# Patient Record
Sex: Female | Born: 1975 | Race: White | Hispanic: No | Marital: Married | Smoking: Never smoker
Health system: Southern US, Community
[De-identification: ages and names within clinical notes are randomized; demographics above are authoritative.]

## PROBLEM LIST (undated history)

## (undated) DIAGNOSIS — J45909 Unspecified asthma, uncomplicated: Secondary | ICD-10-CM

## (undated) DIAGNOSIS — H40033 Anatomical narrow angle, bilateral: Secondary | ICD-10-CM

## (undated) HISTORY — DX: Unspecified asthma, uncomplicated: J45.909

## (undated) HISTORY — DX: Anatomical narrow angle, bilateral: H40.033

---
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
## 2010-03-08 HISTORY — PX: BREAST SURGERY: SHX581

## 2013-03-08 NOTE — L&D Delivery Note (Signed)
Delivery Note At 5:57 AM a viable female was delivered via Vaginal, Spontaneous Delivery (Presentation: ROA).  APGAR: 8, 9 .   Placenta status: Intact, Spontaneous via [name].  Cord: 3 vessels with the following complications: None. Mild uterine atony--blood clots removed from the LUS.  Adequate response to fundal massage.   Anesthesia: Epidural Local  Episiotomy: None Lacerations: 1st degree Suture Repair: 3.0 vicryl rapide Est. Blood Loss (mL):  300 ml  Mom to postpartum.  Baby to Couplet care / Skin to Skin.  [name],Raven Shawhan A 01/13/2014, 6:27 AM

## 2013-06-27 LAB — OB RESULTS CONSOLE HIV ANTIBODY (ROUTINE TESTING): HIV: NONREACTIVE

## 2013-06-27 LAB — OB RESULTS CONSOLE ANTIBODY SCREEN: Antibody Screen: NEGATIVE

## 2013-06-27 LAB — OB RESULTS CONSOLE RPR: RPR: NONREACTIVE

## 2013-06-27 LAB — OB RESULTS CONSOLE HEPATITIS B SURFACE ANTIGEN: Hepatitis B Surface Ag: NEGATIVE

## 2013-06-27 LAB — OB RESULTS CONSOLE ABO/RH: RH TYPE: POSITIVE

## 2013-06-27 LAB — OB RESULTS CONSOLE HGB/HCT, BLOOD
HCT: 35 %
HEMOGLOBIN: 11.9 g/dL

## 2013-06-27 LAB — OB RESULTS CONSOLE RUBELLA ANTIBODY, IGM: RUBELLA: IMMUNE

## 2013-06-27 LAB — OB RESULTS CONSOLE PLATELET COUNT: PLATELETS: 216 10*3/uL

## 2013-10-10 ENCOUNTER — Ambulatory Visit (INDEPENDENT_AMBULATORY_CARE_PROVIDER_SITE_OTHER): Payer: BC Managed Care – PPO | Admitting: Obstetrics & Gynecology

## 2013-10-10 ENCOUNTER — Encounter: Payer: Self-pay | Admitting: Obstetrics & Gynecology

## 2013-10-10 VITALS — BP 101/61 | HR 68 | Temp 97.2°F | Ht 65.0 in | Wt 139.0 lb

## 2013-10-10 DIAGNOSIS — O09522 Supervision of elderly multigravida, second trimester: Secondary | ICD-10-CM

## 2013-10-10 DIAGNOSIS — Z3482 Encounter for supervision of other normal pregnancy, second trimester: Secondary | ICD-10-CM

## 2013-10-10 DIAGNOSIS — Z3480 Encounter for supervision of other normal pregnancy, unspecified trimester: Secondary | ICD-10-CM

## 2013-10-10 DIAGNOSIS — O09529 Supervision of elderly multigravida, unspecified trimester: Secondary | ICD-10-CM

## 2013-10-10 LAB — CBC
HCT: 32.1 % — ABNORMAL LOW (ref 36.0–46.0)
Hemoglobin: 10.6 g/dL — ABNORMAL LOW (ref 12.0–15.0)
MCH: 28 pg (ref 26.0–34.0)
MCHC: 33 g/dL (ref 30.0–36.0)
MCV: 84.9 fL (ref 78.0–100.0)
PLATELETS: 191 10*3/uL (ref 150–400)
RBC: 3.78 MIL/uL — ABNORMAL LOW (ref 3.87–5.11)
RDW: 14.4 % (ref 11.5–15.5)
WBC: 8.1 10*3/uL (ref 4.0–10.5)

## 2013-10-10 NOTE — Patient Instructions (Signed)
Tdap Vaccine (Tetanus, Diphtheria, Pertussis): What You Need to Know 1. Why get vaccinated? Tetanus, diphtheria and pertussis can be very serious diseases, even for adolescents and adults. Tdap vaccine can protect us from these diseases. TETANUS (Lockjaw) causes painful muscle tightening and stiffness, usually all over the body.  It can lead to tightening of muscles in the head and neck so you can't open your mouth, swallow, or sometimes even breathe. Tetanus kills about 1 out of 5 people who are infected. DIPHTHERIA can cause a thick coating to form in the back of the throat.  It can lead to breathing problems, paralysis, heart failure, and death. PERTUSSIS (Whooping Cough) causes severe coughing spells, which can cause difficulty breathing, vomiting and disturbed sleep.  It can also lead to weight loss, incontinence, and rib fractures. Up to 2 in 100 adolescents and 5 in 100 adults with pertussis are hospitalized or have complications, which could include pneumonia or death. These diseases are caused by bacteria. Diphtheria and pertussis are spread from person to person through coughing or sneezing. Tetanus enters the body through cuts, scratches, or wounds. Before vaccines, the [place] saw as many as 200,000 cases a year of diphtheria and pertussis, and hundreds of cases of tetanus. Since vaccination began, tetanus and diphtheria have dropped by about 99% and pertussis by about 80%. 2. Tdap vaccine Tdap vaccine can protect adolescents and adults from tetanus, diphtheria, and pertussis. One dose of Tdap is routinely given at age 11 or 12. People who did not get Tdap at that age should get it as soon as possible. Tdap is especially important for health care professionals and anyone having close contact with a baby younger than 12 months. Pregnant women should get a dose of Tdap during every pregnancy, to protect the newborn from pertussis. Infants are most at risk for severe, life-threatening  complications from pertussis. A similar vaccine, called Td, protects from tetanus and diphtheria, but not pertussis. A Td booster should be given every 10 years. Tdap may be given as one of these boosters if you have not already gotten a dose. Tdap may also be given after a severe cut or burn to prevent tetanus infection. Your doctor can give you more information. Tdap may safely be given at the same time as other vaccines. 3. Some people should not get this vaccine  If you ever had a life-threatening allergic reaction after a dose of any tetanus, diphtheria, or pertussis containing vaccine, OR if you have a severe allergy to any part of this vaccine, you should not get Tdap. Tell your doctor if you have any severe allergies.  If you had a coma, or long or multiple seizures within 7 days after a childhood dose of DTP or DTaP, you should not get Tdap, unless a cause other than the vaccine was found. You can still get Td.  Talk to your doctor if you:  have epilepsy or another nervous system problem,  had severe pain or swelling after any vaccine containing diphtheria, tetanus or pertussis,  ever had Guillain-Barr Syndrome (GBS),  aren't feeling well on the day the shot is scheduled. 4. Risks of a vaccine reaction With any medicine, including vaccines, there is a chance of side effects. These are usually mild and go away on their own, but serious reactions are also possible. Brief fainting spells can follow a vaccination, leading to injuries from falling. Sitting or lying down for about 15 minutes can help prevent these. Tell your doctor if you feel   dizzy or light-headed, or have vision changes or ringing in the ears. Mild problems following Tdap (Did not interfere with activities)  Pain where the shot was given (about 3 in 4 adolescents or 2 in 3 adults)  Redness or swelling where the shot was given (about 1 person in 5)  Mild fever of at least 100.78F (up to about 1 in 25 adolescents or  1 in 100 adults)  Headache (about 3 or 4 people in 10)  Tiredness (about 1 person in 3 or 4)  Nausea, vomiting, diarrhea, stomach ache (up to 1 in 4 adolescents or 1 in 10 adults)  Chills, body aches, sore joints, rash, swollen glands (uncommon) Moderate problems following Tdap (Interfered with activities, but did not require medical attention)  Pain where the shot was given (about 1 in 5 adolescents or 1 in 100 adults)  Redness or swelling where the shot was given (up to about 1 in 16 adolescents or 1 in 25 adults)  Fever over 102F (about 1 in 100 adolescents or 1 in 250 adults)  Headache (about 3 in 20 adolescents or 1 in 10 adults)  Nausea, vomiting, diarrhea, stomach ache (up to 1 or 3 people in 100)  Swelling of the entire arm where the shot was given (up to about 3 in 100). Severe problems following Tdap (Unable to perform usual activities; required medical attention)  Swelling, severe pain, bleeding and redness in the arm where the shot was given (rare). A severe allergic reaction could occur after any vaccine (estimated less than 1 in a million doses). 5. What if there is a serious reaction? What should I look for?  Look for anything that concerns you, such as signs of a severe allergic reaction, very high fever, or behavior changes. Signs of a severe allergic reaction can include hives, swelling of the face and throat, difficulty breathing, a fast heartbeat, dizziness, and weakness. These would start a few minutes to a few hours after the vaccination. What should I do?  If you think it is a severe allergic reaction or other emergency that can't wait, call 9-1-1 or get the person to the nearest hospital. Otherwise, call your doctor.  Afterward, the reaction should be reported to the "Vaccine Adverse Event Reporting System" (VAERS). Your doctor might file this report, or you can do it yourself through the VAERS web site at www.vaers.LAgents.no, or by calling  1-760-415-3001. VAERS is only for reporting reactions. They do not give medical advice.  6. The National Vaccine Injury Compensation Program The [employer] Vaccine Injury Compensation Program (VICP) is a federal program that was created to compensate people who may have been injured by certain vaccines. Persons who believe they may have been injured by a vaccine can learn about the program and about filing a claim by calling 1-(405)444-7602 or visiting the VICP website at SpiritualWord.at. 7. How can I learn more?  Ask your doctor.  Call your local or state health department.  Contact the Centers for Disease Control and Prevention (CDC):  Call 224-453-7114 or visit CDC's website at PicCapture.uy. CDC Tdap Vaccine VIS (07/15/11) Document Released: 08/24/2011 Document Revised: 07/09/2013 Document Reviewed: 06/06/2013 ExitCare Patient Information 2015 [place], [place]. This information is not intended to replace advice given to you by your health care provider. Make sure you discuss any questions you have with your health care provider. Levonorgestrel intrauterine device (IUD) What is this medicine? LEVONORGESTREL IUD (LEE voe nor jes trel) is a contraceptive (birth control) device. The device is placed  inside the uterus by a healthcare professional. It is used to prevent pregnancy and can also be used to treat heavy bleeding that occurs during your period. Depending on the device, it can be used for 3 to 5 years. This medicine may be used for other purposes; ask your health care provider or pharmacist if you have questions. COMMON BRAND NAME(S): Elveria RoyalsLILETTA, Mirena, Skyla What should I tell my health care provider before I take this medicine? They need to know if you have any of these conditions: -abnormal Pap smear -cancer of the breast, uterus, or cervix -diabetes -endometritis -genital or pelvic infection now or in the past -have more than one sexual partner or your  partner has more than one partner -heart disease -history of an ectopic or tubal pregnancy -immune system problems -IUD in place -liver disease or tumor -problems with blood clots or take blood-thinners -use intravenous drugs -uterus of unusual shape -vaginal bleeding that has not been explained -an unusual or allergic reaction to levonorgestrel, other hormones, silicone, or polyethylene, medicines, foods, dyes, or preservatives -pregnant or trying to get pregnant -breast-feeding How should I use this medicine? This device is placed inside the uterus by a health care professional. Talk to your pediatrician regarding the use of this medicine in children. Special care may be needed. Overdosage: If you think you have taken too much of this medicine contact a poison control center or emergency room at once. NOTE: This medicine is only for you. Do not share this medicine with others. What if I miss a dose? This does not apply. What may interact with this medicine? Do not take this medicine with any of the following medications: -amprenavir -bosentan -fosamprenavir This medicine may also interact with the following medications: -aprepitant -barbiturate medicines for inducing sleep or treating seizures -bexarotene -griseofulvin -medicines to treat seizures like carbamazepine, ethotoin, felbamate, oxcarbazepine, phenytoin, topiramate -modafinil -pioglitazone -rifabutin -rifampin -rifapentine -some medicines to treat HIV infection like atazanavir, indinavir, lopinavir, nelfinavir, tipranavir, ritonavir -St. John's wort -warfarin This list may not describe all possible interactions. Give your health care provider a list of all the medicines, herbs, non-prescription drugs, or dietary supplements you use. Also tell them if you smoke, drink alcohol, or use illegal drugs. Some items may interact with your medicine. What should I watch for while using this medicine? Visit your doctor or  health care professional for regular check ups. See your doctor if you or your partner has sexual contact with others, becomes HIV positive, or gets a sexual transmitted disease. This product does not protect you against HIV infection (AIDS) or other sexually transmitted diseases. You can check the placement of the IUD yourself by reaching up to the top of your vagina with clean fingers to feel the threads. Do not pull on the threads. It is a good habit to check placement after each menstrual period. Call your doctor right away if you feel more of the IUD than just the threads or if you cannot feel the threads at all. The IUD may come out by itself. You may become pregnant if the device comes out. If you notice that the IUD has come out use a backup birth control method like condoms and call your health care provider. Using tampons will not change the position of the IUD and are okay to use during your period. What side effects may I notice from receiving this medicine? Side effects that you should report to your doctor or health care professional as soon as possible: -allergic  reactions like skin rash, itching or hives, swelling of the face, lips, or tongue -fever, flu-like symptoms -genital sores -high blood pressure -no menstrual period for 6 weeks during use -pain, swelling, warmth in the leg -pelvic pain or tenderness -severe or sudden headache -signs of pregnancy -stomach cramping -sudden shortness of breath -trouble with balance, talking, or walking -unusual vaginal bleeding, discharge -yellowing of the eyes or skin Side effects that usually do not require medical attention (report to your doctor or health care professional if they continue or are bothersome): -acne -breast pain -change in sex drive or performance -changes in weight -cramping, dizziness, or faintness while the device is being inserted -headache -irregular menstrual bleeding within first 3 to 6 months of  use -nausea This list may not describe all possible side effects. Call your doctor for medical advice about side effects. You may report side effects to FDA at 1-800-FDA-1088. Where should I keep my medicine? This does not apply. NOTE: This sheet is a summary. It may not cover all possible information. If you have questions about this medicine, talk to your doctor, pharmacist, or health care provider.  2015, [employer]. (2011-03-25 13:54:04) Second Trimester of Pregnancy The second trimester is from week 13 through week 28, months 4 through 6. The second trimester is often a time when you feel your best. Your body has also adjusted to being pregnant, and you begin to feel better physically. Usually, morning sickness has lessened or quit completely, you may have more energy, and you may have an increase in appetite. The second trimester is also a time when the fetus is growing rapidly. At the end of the sixth month, the fetus is about 9 inches long and weighs about 1 pounds. You will likely begin to feel the baby move (quickening) between 18 and 20 weeks of the pregnancy. BODY CHANGES Your body goes through many changes during pregnancy. The changes vary from woman to woman.   Your weight will continue to increase. You will notice your lower abdomen bulging out.  You may begin to get stretch marks on your hips, abdomen, and breasts.  You may develop headaches that can be relieved by medicines approved by your health care provider.  You may urinate more often because the fetus is pressing on your bladder.  You may develop or continue to have heartburn as a result of your pregnancy.  You may develop constipation because certain hormones are causing the muscles that push waste through your intestines to slow down.  You may develop hemorrhoids or swollen, bulging veins (varicose veins).  You may have back pain because of the weight gain and pregnancy hormones relaxing your joints  between the bones in your pelvis and as a result of a shift in weight and the muscles that support your balance.  Your breasts will continue to grow and be tender.  Your gums may bleed and may be sensitive to brushing and flossing.  Dark spots or blotches (chloasma, mask of pregnancy) may develop on your face. This will likely fade after the baby is born.  A dark line from your belly button to the pubic area (linea nigra) may appear. This will likely fade after the baby is born.  You may have changes in your hair. These can include thickening of your hair, rapid growth, and changes in texture. Some women also have hair loss during or after pregnancy, or hair that feels dry or thin. Your hair will most likely return to normal after your  baby is born. WHAT TO EXPECT AT YOUR PRENATAL VISITS During a routine prenatal visit:  You will be weighed to make sure you and the fetus are growing normally.  Your blood pressure will be taken.  Your abdomen will be measured to track your baby's growth.  The fetal heartbeat will be listened to.  Any test results from the previous visit will be discussed. Your health care provider may ask you:  How you are feeling.  If you are feeling the baby move.  If you have had any abnormal symptoms, such as leaking fluid, bleeding, severe headaches, or abdominal cramping.  If you have any questions. Other tests that may be performed during your second trimester include:  Blood tests that check for:  Low iron levels (anemia).  Gestational diabetes (between 24 and 28 weeks).  Rh antibodies.  Urine tests to check for infections, diabetes, or protein in the urine.  An ultrasound to confirm the proper growth and development of the baby.  An amniocentesis to check for possible genetic problems.  Fetal screens for spina bifida and Down syndrome. HOME CARE INSTRUCTIONS   Avoid all smoking, herbs, alcohol, and unprescribed drugs. These chemicals affect  the formation and growth of the baby.  Follow your health care provider's instructions regarding medicine use. There are medicines that are either safe or unsafe to take during pregnancy.  Exercise only as directed by your health care provider. Experiencing uterine cramps is a good sign to stop exercising.  Continue to eat regular, healthy meals.  Wear a good support bra for breast tenderness.  Do not use hot tubs, steam rooms, or saunas.  Wear your seat belt at all times when driving.  Avoid raw meat, uncooked cheese, cat litter boxes, and soil used by cats. These carry germs that can cause birth defects in the baby.  Take your prenatal vitamins.  Try taking a stool softener (if your health care provider approves) if you develop constipation. Eat more high-fiber foods, such as fresh vegetables or fruit and whole grains. Drink plenty of fluids to keep your urine clear or pale yellow.  Take warm sitz baths to soothe any pain or discomfort caused by hemorrhoids. Use hemorrhoid cream if your health care provider approves.  If you develop varicose veins, wear support hose. Elevate your feet for 15 minutes, 3-4 times a day. Limit salt in your diet.  Avoid heavy lifting, wear low heel shoes, and practice good posture.  Rest with your legs elevated if you have leg cramps or low back pain.  Visit your dentist if you have not gone yet during your pregnancy. Use a soft toothbrush to brush your teeth and be gentle when you floss.  A sexual relationship may be continued unless your health care provider directs you otherwise.  Continue to go to all your prenatal visits as directed by your health care provider. SEEK MEDICAL CARE IF:   You have dizziness.  You have mild pelvic cramps, pelvic pressure, or nagging pain in the abdominal area.  You have persistent nausea, vomiting, or diarrhea.  You have a bad smelling vaginal discharge.  You have pain with urination. SEEK IMMEDIATE MEDICAL  CARE IF:   You have a fever.  You are leaking fluid from your vagina.  You have spotting or bleeding from your vagina.  You have severe abdominal cramping or pain.  You have rapid weight gain or loss.  You have shortness of breath with chest pain.  You notice sudden or extreme  swelling of your face, hands, ankles, feet, or legs.  You have not felt your baby move in over an hour.  You have severe headaches that do not go away with medicine.  You have vision changes. Document Released: 02/16/2001 Document Revised: 02/27/2013 Document Reviewed: 04/25/2012 [facility] Patient Information 2015 [place], [place]. This information is not intended to replace advice given to you by your health care provider. Make sure you discuss any questions you have with your health care provider.

## 2013-10-10 NOTE — Progress Notes (Signed)
Subjective:    Raven [name] is a 38 y.o. female being seen today for her obstetrical visit. She is at 7624w2d gestation. Patient reports: no complaints . Fetal movement: normal.  Problem List Items Addressed This Visit     Unprioritized   Elderly multigravida in second trimester    Other Visit Diagnoses   Encounter for supervision of other normal pregnancy in second trimester    -  Primary    Relevant Orders       POCT urinalysis dipstick       Glucose Tolerance, 2 Hours w/1 Hour       CBC       HIV antibody       RPR      Patient Active Problem List   Diagnosis Date Noted  . Elderly multigravida in second trimester 10/10/2013   Objective:    BP 101/61  Pulse 68  Temp(Src) 97.2 F (36.2 C)  Ht 5\' 5"  (1.651 m)  Wt 63.05 kg (139 lb)  BMI 23.13 kg/m2 FHT: 160 BPM  Uterine Size: size equals dates     Assessment:    Pregnancy @ 4024w2d    Plan:    Labs, problem list reviewed and updated 2 hr GTT today Follow up in 4 weeks.

## 2013-10-11 LAB — HIV ANTIBODY (ROUTINE TESTING W REFLEX): HIV 1&2 Ab, 4th Generation: NONREACTIVE

## 2013-10-11 LAB — GLUCOSE TOLERANCE, 2 HOURS W/ 1HR
GLUCOSE, FASTING: 62 mg/dL — AB (ref 70–99)
GLUCOSE: 62 mg/dL — AB (ref 70–170)
Glucose, 2 hour: 74 mg/dL (ref 70–139)

## 2013-10-11 LAB — RPR

## 2013-11-07 ENCOUNTER — Encounter: Payer: Self-pay | Admitting: Obstetrics & Gynecology

## 2013-11-07 ENCOUNTER — Ambulatory Visit (INDEPENDENT_AMBULATORY_CARE_PROVIDER_SITE_OTHER): Payer: BC Managed Care – PPO | Admitting: Obstetrics & Gynecology

## 2013-11-07 VITALS — BP 115/66 | HR 74 | Temp 98.6°F | Wt 141.0 lb

## 2013-11-07 DIAGNOSIS — Z23 Encounter for immunization: Secondary | ICD-10-CM

## 2013-11-07 DIAGNOSIS — Z3480 Encounter for supervision of other normal pregnancy, unspecified trimester: Secondary | ICD-10-CM

## 2013-11-07 DIAGNOSIS — Z3483 Encounter for supervision of other normal pregnancy, third trimester: Secondary | ICD-10-CM

## 2013-11-07 LAB — POCT URINALYSIS DIPSTICK
Bilirubin, UA: NEGATIVE
Blood, UA: NEGATIVE
GLUCOSE UA: NEGATIVE
KETONES UA: NEGATIVE
Leukocytes, UA: NEGATIVE
Nitrite, UA: NEGATIVE
PROTEIN UA: NEGATIVE
Spec Grav, UA: 1.02
Urobilinogen, UA: NEGATIVE
pH, UA: 6

## 2013-11-07 MED ORDER — TETANUS-DIPHTH-ACELL PERTUSSIS 5-2.5-18.5 LF-MCG/0.5 IM SUSP
0.5000 mL | Freq: Once | INTRAMUSCULAR | Status: AC
  Administered 2013-11-07: 0.5 mL via INTRAMUSCULAR

## 2013-11-07 NOTE — Addendum Note (Signed)
Addended by: Odessa Fleming on: 11/07/2013 05:18 PM   Modules accepted: Orders

## 2013-11-07 NOTE — Progress Notes (Signed)
Subjective:    Raven [name] is a 38 y.o. female being seen today for her obstetrical visit. She is at [redacted]w[redacted]d gestation. Patient reports no complaints. Fetal movement: normal.  Problem List Items Addressed This Visit   None     Patient Active Problem List   Diagnosis Date Noted  . Elderly multigravida in second trimester 10/10/2013   Objective:    BP 115/66  Pulse 74  Temp(Src) 98.6 F (37 C)  Wt 63.957 kg (141 lb) FHT:  140 BPM  Uterine Size: size equals dates  Presentation: cephalic     Assessment:    Pregnancy @ [redacted]w[redacted]d weeks   Plan:     labs reviewed, problem list updated TDAP offered  Pediatrician: discussed. Infant feeding: plans to breastfeed. Maternity leave: discussed. Orders Placed This Encounter  Procedures  . US OB Follow Up    Standing Status: Future     Number of Occurrences:      Standing Expiration Date: 01/08/2015    Order Specific Question:  Reason for Exam (SYMPTOM  OR DIAGNOSIS REQUIRED)    Answer:  Growth    Order Specific Question:  Preferred imaging location?    Answer:  [facility]   Cigarette smoking: never smoked.  Follow up in 2 Weeks.

## 2013-11-07 NOTE — Patient Instructions (Signed)

## 2013-11-15 ENCOUNTER — Ambulatory Visit (HOSPITAL_COMMUNITY)
Admission: RE | Admit: 2013-11-15 | Discharge: 2013-11-15 | Disposition: A | Discharge status: Home / Self Care | Payer: BC Managed Care – PPO | Source: Ambulatory Visit | Attending: Obstetrics & Gynecology | Admitting: Obstetrics & Gynecology

## 2013-11-15 ENCOUNTER — Encounter (HOSPITAL_COMMUNITY): Payer: Self-pay

## 2013-11-15 VITALS — BP 113/47 | HR 79 | Wt 140.0 lb

## 2013-11-15 DIAGNOSIS — Z3689 Encounter for other specified antenatal screening: Secondary | ICD-10-CM | POA: Insufficient documentation

## 2013-11-15 DIAGNOSIS — Z363 Encounter for antenatal screening for malformations: Secondary | ICD-10-CM | POA: Insufficient documentation

## 2013-11-15 DIAGNOSIS — Z3483 Encounter for supervision of other normal pregnancy, third trimester: Secondary | ICD-10-CM

## 2013-11-15 DIAGNOSIS — Z1389 Encounter for screening for other disorder: Secondary | ICD-10-CM

## 2013-11-15 DIAGNOSIS — O09523 Supervision of elderly multigravida, third trimester: Secondary | ICD-10-CM

## 2013-11-15 DIAGNOSIS — O09529 Supervision of elderly multigravida, unspecified trimester: Secondary | ICD-10-CM | POA: Diagnosis not present

## 2013-11-21 ENCOUNTER — Encounter: Payer: BC Managed Care – PPO | Admitting: Obstetrics & Gynecology

## 2013-12-14 ENCOUNTER — Encounter: Payer: Self-pay | Admitting: Obstetrics & Gynecology

## 2013-12-14 ENCOUNTER — Ambulatory Visit (INDEPENDENT_AMBULATORY_CARE_PROVIDER_SITE_OTHER): Payer: BC Managed Care – PPO | Admitting: Obstetrics & Gynecology

## 2013-12-14 VITALS — BP 115/71 | HR 91 | Temp 97.7°F | Wt 146.0 lb

## 2013-12-14 DIAGNOSIS — O09523 Supervision of elderly multigravida, third trimester: Secondary | ICD-10-CM

## 2013-12-14 DIAGNOSIS — Z23 Encounter for immunization: Secondary | ICD-10-CM

## 2013-12-14 DIAGNOSIS — Z3483 Encounter for supervision of other normal pregnancy, third trimester: Secondary | ICD-10-CM

## 2013-12-14 DIAGNOSIS — O4693 Antepartum hemorrhage, unspecified, third trimester: Secondary | ICD-10-CM

## 2013-12-14 LAB — CBC
HCT: 31.4 % — ABNORMAL LOW (ref 36.0–46.0)
Hemoglobin: 10.6 g/dL — ABNORMAL LOW (ref 12.0–15.0)
MCH: 27.2 pg (ref 26.0–34.0)
MCHC: 33.8 g/dL (ref 30.0–36.0)
MCV: 80.7 fL (ref 78.0–100.0)
Platelets: 192 10*3/uL (ref 150–400)
RBC: 3.89 MIL/uL (ref 3.87–5.11)
RDW: 14.9 % (ref 11.5–15.5)
WBC: 7.3 10*3/uL (ref 4.0–10.5)

## 2013-12-14 LAB — POCT URINALYSIS DIPSTICK
Bilirubin, UA: NEGATIVE
Blood, UA: NEGATIVE
Glucose, UA: NEGATIVE
KETONES UA: NEGATIVE
Nitrite, UA: NEGATIVE
PH UA: 7
Protein, UA: NEGATIVE
Spec Grav, UA: 1.01
Urobilinogen, UA: NEGATIVE

## 2013-12-14 NOTE — Progress Notes (Signed)
Subjective:    Raven [name] is a 38 y.o. female being seen today for her obstetrical visit. She is at 3961w4d gestation. Patient reports no complaints and episode of spotting several days ago--associated cramping.  Denies recent coitus, trauma.  C/O SOB, fatigue.. Fetal movement: normal.  Problem List Items Addressed This Visit   None    Visit Diagnoses   Encounter for supervision of other normal pregnancy in third trimester    -  Primary    Relevant Orders       POCT urinalysis dipstick (Completed)       Strep B DNA probe       CBC    Vaginal bleeding in pregnancy, third trimester        Relevant Orders       US OB Follow Up       US Fetal BPP W/O Non Stress    Need for immunization against influenza        Relevant Orders       Flu Vaccine QUAD 36+ mos IM (Fluarix) (Completed)      Patient Active Problem List   Diagnosis Date Noted  . Elderly multigravida in second trimester 10/10/2013   Objective:    BP 115/71  Pulse 91  Temp(Src) 97.7 F (36.5 C)  Wt 66.225 kg (146 lb) FHT:  140 BPM  Uterine Size: size equals dates  Presentation: cephalic     Assessment:    Pregnancy @ 5761w4d weeks   Plan:     labs reviewed, problem list updated TDAP offered  Pediatrician: discussed. Infant feeding: plans to breastfeed. Maternity leave: discussed. Orders Placed This Encounter  Procedures  . Strep B DNA probe  . US OB Follow Up    Standing Status: Future     Number of Occurrences:      Standing Expiration Date: 02/14/2015    Scheduling Instructions:     Needs to be scheduled for this Wednesday please.    Order Specific Question:  Reason for Exam (SYMPTOM  OR DIAGNOSIS REQUIRED)    Answer:  Bleeding in Third Trimester    Order Specific Question:  Preferred imaging location?    Answer:  Internal  . US Fetal BPP W/O Non Stress    Standing Status: Future     Number of Occurrences:      Standing Expiration Date: 02/14/2015    Order Specific Question:  Reason for Exam (SYMPTOM   OR DIAGNOSIS REQUIRED)    Answer:  bleeding in third trimester.    Order Specific Question:  Preferred imaging location?    Answer:  Internal  . Flu Vaccine QUAD 36+ mos IM (Fluarix)  . CBC  . POCT urinalysis dipstick   Cigarette smoking: never smoked.  Follow up in 2 Weeks.

## 2013-12-16 LAB — STREP B DNA PROBE: GBSP: NOT DETECTED

## 2013-12-16 NOTE — Patient Instructions (Signed)
Patient information: Group B streptococcus and pregnancy (Beyond the Basics)  Authors Karen M Puopolo, MD, PhD Carol J Baker, MD Section Editors Charles J Lockwood, MD Daniel J Sexton, MD Deputy Editor Vanessa A Barss, MD Disclosures  All topics are updated as new evidence becomes available and our peer review process is complete.  Literature review current through: Feb 2014.  This topic last updated: Sep 06, 2011.  INTRODUCTION - Group B streptococcus (GBS) is a bacterium that can cause serious infections in pregnant women and newborn babies. GBS is one of many types of streptococcal bacteria, sometimes called "strep." This article discusses GBS, its effect on pregnant women and infants, and ways to prevent complications of GBS. More detailed information about GBS is available by subscription. (See "Group B streptococcal infection in pregnant women".) WHAT IS GROUP B STREP INFECTION? - GBS is commonly found in the digestive system and the vagina. In healthy adults, GBS is not harmful and does not cause problems. But in pregnant women and newborn infants, being infected with GBS can cause serious illness. Approximately one in three to four pregnant women in the [place] carries GBS in their gastrointestinal system and/or in their vagina. Carrying GBS is not the same as being infected. Carriers are not sick and do not need treatment during pregnancy. There is no treatment that can stop you from carrying GBS.  Pregnant women who are carriers of GBS infrequently become infected with GBS. GBS can cause urinary tract infections, infection of the amniotic fluid (bag of water), and infection of the uterus after delivery. GBS infections during pregnancy may lead to preterm labor.  Pregnant women who carry GBS can pass on the bacteria to their newborns, and some of those babies become infected with GBS. Newborns who are infected with GBS can develop pneumonia (lung infection), septicemia (blood infection), or  meningitis (infection of the lining of the brain and spinal cord). These complications can be prevented by giving intravenous antibiotics during labor to any woman who is at risk of GBS infection. You are at risk of GBS infection if: You have a urine culture during your current pregnancy showing GBS  You have a vaginal and rectal culture during your current pregnancy showing GBS  You had an infant infected with GBS in the past GROUP B STREP PREVENTION - Most doctors and nurses recommend a urine culture early in your pregnancy to be sure that you do not have a bladder infection without symptoms. If you urine culture shows GBS or other bacteria, you may be treated with an antibiotic. If you have symptoms of urinary infection, such as pain with urination, any time during your pregnancy, a urine culture is done. If GBS grows from the urine culture, it should be treated with an antibiotic, and you should also receive intravenous antibiotics during labor. Expert groups recommend that all pregnant women have a GBS culture at 35 to 37 weeks of pregnancy. The culture is done by swabbing the vagina and rectum. If your GBS culture is positive, you will be given an intravenous antibiotic during labor. If you have preterm labor, the culture is done then and an intravenous antibiotic is given until the baby is born or the labor is stopped by your health care provider. If you have a positive GBS culture and you have an allergy to penicillin, be sure your doctor and nurse are aware of this allergy and tell them what happened with the allergy. If you had only a rash or itching, this   is not a serious allergy, and you can receive a common drug related to the penicillin. If you had a serious allergy (for example, trouble breathing, swelling of your face) you may need an additional test to determine which antibiotic should be used during labor. Being treated with an antibiotic during labor greatly reduces the chance that you or  your newborn will develop infections related to GBS. It is important to note that young infants up to age 3 months can also develop septicemia, meningitis and other serious infections from GBS. Being treated with an antibiotic during labor does not reduce the chance that your baby will develop this later type of infection. There is currently no known way of preventing this later-onset GBS disease. WHERE TO GET MORE INFORMATION - Your healthcare provider is the best source of information for questions and concerns related to your medical problem.  

## 2013-12-19 ENCOUNTER — Other Ambulatory Visit: Payer: Self-pay | Admitting: Obstetrics & Gynecology

## 2013-12-19 ENCOUNTER — Ambulatory Visit (INDEPENDENT_AMBULATORY_CARE_PROVIDER_SITE_OTHER): Payer: BC Managed Care – PPO

## 2013-12-19 DIAGNOSIS — O09523 Supervision of elderly multigravida, third trimester: Secondary | ICD-10-CM

## 2013-12-19 DIAGNOSIS — O3663X2 Maternal care for excessive fetal growth, third trimester, fetus 2: Secondary | ICD-10-CM | POA: Diagnosis not present

## 2013-12-19 DIAGNOSIS — O4693 Antepartum hemorrhage, unspecified, third trimester: Secondary | ICD-10-CM | POA: Diagnosis not present

## 2014-01-02 ENCOUNTER — Ambulatory Visit (INDEPENDENT_AMBULATORY_CARE_PROVIDER_SITE_OTHER): Payer: BC Managed Care – PPO | Admitting: Obstetrics & Gynecology

## 2014-01-02 VITALS — BP 115/69 | HR 79 | Temp 97.4°F | Wt 147.0 lb

## 2014-01-02 DIAGNOSIS — Z3483 Encounter for supervision of other normal pregnancy, third trimester: Secondary | ICD-10-CM

## 2014-01-02 DIAGNOSIS — O09523 Supervision of elderly multigravida, third trimester: Secondary | ICD-10-CM

## 2014-01-03 ENCOUNTER — Encounter: Payer: Self-pay | Admitting: Obstetrics & Gynecology

## 2014-01-03 NOTE — Patient Instructions (Signed)
Labor Induction  Labor induction is when steps are taken to cause a pregnant woman to begin the labor process. Most women go into labor on their own between 37 weeks and 42 weeks of the pregnancy. When this does not happen or when there is a medical need, methods may be used to induce labor. Labor induction causes a pregnant woman's uterus to contract. It also causes the cervix to soften (ripen), open (dilate), and thin out (efface). Usually, labor is not induced before 39 weeks of the pregnancy unless there is a problem with the baby or mother.  Before inducing labor, your health care provider will consider a number of factors, including the following:  The medical condition of you and the baby.   How many weeks along you are.   The status of the baby's lung maturity.   The condition of the cervix.   The position of the baby.  WHAT ARE THE REASONS FOR LABOR INDUCTION? Labor may be induced for the following reasons:  The health of the baby or mother is at risk.   The pregnancy is overdue by 1 week or more.   The water breaks but labor does not start on its own.   The mother has a health condition or serious illness, such as high blood pressure, infection, placental abruption, or diabetes.  The amniotic fluid amounts are low around the baby.   The baby is distressed.  Convenience or wanting the baby to be born on a certain date is not a reason for inducing labor. WHAT METHODS ARE USED FOR LABOR INDUCTION? Several methods of labor induction may be used, such as:   Prostaglandin medicine. This medicine causes the cervix to dilate and ripen. The medicine will also start contractions. It can be taken by mouth or by inserting a suppository into the vagina.   Inserting a thin tube (catheter) with a balloon on the end into the vagina to dilate the cervix. Once inserted, the balloon is expanded with water, which causes the cervix to open.   Stripping the membranes. Your health  care provider separates amniotic sac tissue from the cervix, causing the cervix to be stretched and causing the release of a hormone called progesterone. This may cause the uterus to contract. It is often done during an office visit. You will be sent home to wait for the contractions to begin. You will then come in for an induction.   Breaking the water. Your health care provider makes a hole in the amniotic sac using a small instrument. Once the amniotic sac breaks, contractions should begin. This may still take hours to see an effect.   Medicine to trigger or strengthen contractions. This medicine is given through an IV access tube inserted into a vein in your arm.  All of the methods of induction, besides stripping the membranes, will be done in the hospital. Induction is done in the hospital so that you and the baby can be carefully monitored.  HOW LONG DOES IT TAKE FOR LABOR TO BE INDUCED? Some inductions can take up to 2-3 days. Depending on the cervix, it usually takes less time. It takes longer when you are induced early in the pregnancy or if this is your first pregnancy. If a mother is still pregnant and the induction has been going on for 2-3 days, either the mother will be sent home or a cesarean delivery will be needed. WHAT ARE THE RISKS ASSOCIATED WITH LABOR INDUCTION? Some of the risks of induction   include:   Changes in fetal heart rate, such as too high, too low, or erratic.   Fetal distress.   Chance of infection for the mother and baby.   Increased chance of having a cesarean delivery.   Breaking off (abruption) of the placenta from the uterus (rare).   Uterine rupture (very rare).  When induction is needed for medical reasons, the benefits of induction may outweigh the risks. WHAT ARE SOME REASONS FOR NOT INDUCING LABOR? Labor induction should not be done if:   It is shown that your baby does not tolerate labor.   You have had previous surgeries on your  uterus, such as a myomectomy or the removal of fibroids.   Your placenta lies very low in the uterus and blocks the opening of the cervix (placenta previa).   Your baby is not in a head-down position.   The umbilical cord drops down into the birth canal in front of the baby. This could cut off the baby's blood and oxygen supply.   You have had a previous cesarean delivery.   There are unusual circumstances, such as the baby being extremely premature.  Document Released: 07/14/2006 Document Revised: 10/25/2012 Document Reviewed: 09/21/2012 [employer] Patient Information 2015 [employer]. This information is not intended to replace advice given to you by your health care provider. Make sure you discuss any questions you have with your health care provider.  

## 2014-01-03 NOTE — Progress Notes (Signed)
Subjective:    Raven [name] is a 38 y.o. female being seen today for her obstetrical visit. She is at 2662w3d gestation. Patient reports occasional contractions. Fetal movement: normal.  Problem List Items Addressed This Visit   None    Visit Diagnoses   Encounter for supervision of other normal pregnancy in third trimester    -  Primary    Relevant Orders       POCT urinalysis dipstick    AMA (advanced maternal age) multigravida 35+, third trimester        Relevant Orders       Fetal non-stress test      Patient Active Problem List   Diagnosis Date Noted  . Elderly multigravida in second trimester 10/10/2013    Objective:    BP 115/69  Pulse 79  Temp(Src) 97.4 F (36.3 C)  Wt 66.679 kg (147 lb) FHT: 140 BPM  Uterine Size: size equals dates  Presentations: cephalic  Pelvic Exam:              Dilation: 1cm       Effacement: 50%             Station:  -2    Consistency: medium            Position: middle     Assessment:    Pregnancy @ 9862w3d weeks   Plan:   Plans for delivery: Vaginal anticipated; labs reviewed; problem list updated Infant feeding: plans to breastfeed. L&D discussion: symptoms of labor, discussed when to call, discussed what number to call, anesthetic/analgesic options reviewed and delivering clinician:  plans Physician. Postpartum supports and preparation: circumcision discussed and contraception plans discussed. Biweekly NST starting next week; she requests IOL on EDC Follow up in several days

## 2014-01-04 ENCOUNTER — Telehealth (HOSPITAL_COMMUNITY): Payer: Self-pay | Admitting: *Deleted

## 2014-01-04 ENCOUNTER — Encounter (HOSPITAL_COMMUNITY): Payer: Self-pay | Admitting: *Deleted

## 2014-01-04 NOTE — Telephone Encounter (Signed)
Preadmission screen  

## 2014-01-07 ENCOUNTER — Ambulatory Visit (INDEPENDENT_AMBULATORY_CARE_PROVIDER_SITE_OTHER): Payer: BC Managed Care – PPO | Admitting: Obstetrics & Gynecology

## 2014-01-07 ENCOUNTER — Encounter: Payer: Self-pay | Admitting: Obstetrics & Gynecology

## 2014-01-07 VITALS — BP 104/71 | HR 82 | Temp 97.3°F | Wt 149.0 lb

## 2014-01-07 DIAGNOSIS — O09523 Supervision of elderly multigravida, third trimester: Secondary | ICD-10-CM

## 2014-01-07 DIAGNOSIS — Z3483 Encounter for supervision of other normal pregnancy, third trimester: Secondary | ICD-10-CM

## 2014-01-07 LAB — POCT URINALYSIS DIPSTICK
Blood, UA: NEGATIVE
GLUCOSE UA: NEGATIVE
Ketones, UA: NEGATIVE
Nitrite, UA: NEGATIVE
Spec Grav, UA: 1.015
pH, UA: 6

## 2014-01-08 ENCOUNTER — Encounter: Payer: Self-pay | Admitting: Obstetrics & Gynecology

## 2014-01-08 ENCOUNTER — Encounter: Payer: Self-pay | Admitting: *Deleted

## 2014-01-09 NOTE — Progress Notes (Signed)
Subjective:    Raven [name] is a 38 y.o. female being seen today for her obstetrical visit. She is at 6165w0d gestation. Patient reports occasional contractions. Fetal movement: normal.  Problem List Items Addressed This Visit    None    Visit Diagnoses    AMA (advanced maternal age) multigravida 35+, third trimester    -  Primary    Relevant Orders       Fetal non-stress test    Encounter for supervision of other normal pregnancy in third trimester        Relevant Orders       POCT urinalysis dipstick (Completed)      Patient Active Problem List   Diagnosis Date Noted  . Elderly multigravida in second trimester 10/10/2013    Objective:    BP 104/71 mmHg  Pulse 82  Temp(Src) 97.3 F (36.3 C)  Wt 67.586 kg (149 lb) FHT: 140 BPM  Uterine Size: size equals dates  Presentations: cephalic  Pelvic Exam:      Assessment:    Pregnancy @ 7265w0d weeks   Plan:   Follow up in several days

## 2014-01-09 NOTE — Patient Instructions (Signed)

## 2014-01-10 ENCOUNTER — Ambulatory Visit (HOSPITAL_COMMUNITY)
Admission: RE | Admit: 2014-01-10 | Discharge: 2014-01-10 | Disposition: A | Discharge status: Home / Self Care | Payer: BC Managed Care – PPO | Source: Ambulatory Visit | Attending: Obstetrics & Gynecology | Admitting: Obstetrics & Gynecology

## 2014-01-10 ENCOUNTER — Ambulatory Visit (INDEPENDENT_AMBULATORY_CARE_PROVIDER_SITE_OTHER): Payer: BC Managed Care – PPO | Admitting: Obstetrics & Gynecology

## 2014-01-10 VITALS — BP 118/73 | HR 71 | Temp 97.0°F | Wt 152.0 lb

## 2014-01-10 DIAGNOSIS — Z36 Encounter for antenatal screening of mother: Secondary | ICD-10-CM | POA: Diagnosis not present

## 2014-01-10 DIAGNOSIS — Z3A39 39 weeks gestation of pregnancy: Secondary | ICD-10-CM | POA: Insufficient documentation

## 2014-01-10 DIAGNOSIS — O09523 Supervision of elderly multigravida, third trimester: Secondary | ICD-10-CM | POA: Diagnosis present

## 2014-01-10 DIAGNOSIS — O09529 Supervision of elderly multigravida, unspecified trimester: Secondary | ICD-10-CM | POA: Insufficient documentation

## 2014-01-10 LAB — POCT URINALYSIS DIPSTICK
BILIRUBIN UA: NEGATIVE
Blood, UA: NEGATIVE
Glucose, UA: NEGATIVE
Ketones, UA: NEGATIVE
NITRITE UA: NEGATIVE
PROTEIN UA: NEGATIVE
Spec Grav, UA: <=1.005
Urobilinogen, UA: NEGATIVE
pH, UA: 6.5

## 2014-01-11 LAB — POCT URINALYSIS DIPSTICK
BILIRUBIN UA: NEGATIVE
Blood, UA: NEGATIVE
Glucose, UA: NEGATIVE
LEUKOCYTES UA: NEGATIVE
Nitrite, UA: NEGATIVE
PH UA: 7
Protein, UA: NEGATIVE
Spec Grav, UA: <=1.005
Urobilinogen, UA: NEGATIVE

## 2014-01-12 ENCOUNTER — Inpatient Hospital Stay (HOSPITAL_COMMUNITY)
Admission: AD | Admit: 2014-01-12 | Discharge: 2014-01-14 | DRG: 774 | Disposition: A | Discharge status: Home / Self Care | Payer: BC Managed Care – PPO | Source: Ambulatory Visit | Attending: Obstetrics & Gynecology | Admitting: Obstetrics & Gynecology

## 2014-01-12 ENCOUNTER — Encounter (HOSPITAL_COMMUNITY): Payer: Self-pay | Admitting: *Deleted

## 2014-01-12 DIAGNOSIS — D649 Anemia, unspecified: Secondary | ICD-10-CM | POA: Diagnosis present

## 2014-01-12 DIAGNOSIS — Z3A39 39 weeks gestation of pregnancy: Secondary | ICD-10-CM | POA: Diagnosis present

## 2014-01-12 DIAGNOSIS — O9903 Anemia complicating the puerperium: Secondary | ICD-10-CM

## 2014-01-12 DIAGNOSIS — O09523 Supervision of elderly multigravida, third trimester: Secondary | ICD-10-CM | POA: Diagnosis not present

## 2014-01-12 DIAGNOSIS — O368130 Decreased fetal movements, third trimester, not applicable or unspecified: Secondary | ICD-10-CM | POA: Diagnosis present

## 2014-01-12 DIAGNOSIS — O09522 Supervision of elderly multigravida, second trimester: Secondary | ICD-10-CM

## 2014-01-12 DIAGNOSIS — O9902 Anemia complicating childbirth: Secondary | ICD-10-CM | POA: Diagnosis present

## 2014-01-12 LAB — CBC
HCT: 31.4 % — ABNORMAL LOW (ref 36.0–46.0)
Hemoglobin: 10.3 g/dL — ABNORMAL LOW (ref 12.0–15.0)
MCH: 26.8 pg (ref 26.0–34.0)
MCHC: 32.8 g/dL (ref 30.0–36.0)
MCV: 81.6 fL (ref 78.0–100.0)
PLATELETS: 156 10*3/uL (ref 150–400)
RBC: 3.85 MIL/uL — ABNORMAL LOW (ref 3.87–5.11)
RDW: 15.4 % (ref 11.5–15.5)
WBC: 8.8 10*3/uL (ref 4.0–10.5)

## 2014-01-12 LAB — TYPE AND SCREEN
ABO/RH(D): O POS
Antibody Screen: NEGATIVE

## 2014-01-12 LAB — ABO/RH: ABO/RH(D): O POS

## 2014-01-12 MED ORDER — ACETAMINOPHEN 325 MG PO TABS: 650.0000 mg | ORAL_TABLET | ORAL | Status: DC | PRN

## 2014-01-12 MED ORDER — FENTANYL 2.5 MCG/ML BUPIVACAINE 1/10 % EPIDURAL INFUSION (WH - ANES)
14.0000 mL/h | INTRAMUSCULAR | Status: DC | PRN
  Administered 2014-01-13: 14 mL/h via EPIDURAL
  Filled 2014-01-12: qty 125

## 2014-01-12 MED ORDER — OXYCODONE-ACETAMINOPHEN 5-325 MG PO TABS
2.0000 | ORAL_TABLET | ORAL | Status: DC | PRN
Start: 2014-01-12 — End: 2014-01-13

## 2014-01-12 MED ORDER — LACTATED RINGERS IV SOLN
INTRAVENOUS | Status: DC
  Administered 2014-01-12: via INTRAVENOUS

## 2014-01-12 MED ORDER — LIDOCAINE HCL (PF) 1 % IJ SOLN
30.0000 mL | INTRAMUSCULAR | Status: DC | PRN
  Administered 2014-01-13: 30 mL via SUBCUTANEOUS
  Filled 2014-01-12: qty 30

## 2014-01-12 MED ORDER — EPHEDRINE 5 MG/ML INJ
10.0000 mg | INTRAVENOUS | Status: DC | PRN
  Filled 2014-01-12: qty 2

## 2014-01-12 MED ORDER — OXYTOCIN 40 UNITS IN LACTATED RINGERS INFUSION - SIMPLE MED
1.0000 m[IU]/min | INTRAVENOUS | Status: DC
  Administered 2014-01-12: 2 m[IU]/min via INTRAVENOUS
  Filled 2014-01-12: qty 1000

## 2014-01-12 MED ORDER — CITRIC ACID-SODIUM CITRATE 334-500 MG/5ML PO SOLN: 30.0000 mL | ORAL | Status: DC | PRN

## 2014-01-12 MED ORDER — LACTATED RINGERS IV SOLN: 500.0000 mL | Freq: Once | INTRAVENOUS | Status: DC

## 2014-01-12 MED ORDER — PHENYLEPHRINE 40 MCG/ML (10ML) SYRINGE FOR IV PUSH (FOR BLOOD PRESSURE SUPPORT)
80.0000 ug | PREFILLED_SYRINGE | INTRAVENOUS | Status: DC | PRN
  Filled 2014-01-12: qty 10
  Filled 2014-01-12: qty 2

## 2014-01-12 MED ORDER — DIPHENHYDRAMINE HCL 50 MG/ML IJ SOLN: 12.5000 mg | INTRAMUSCULAR | Status: DC | PRN

## 2014-01-12 MED ORDER — LACTATED RINGERS IV SOLN: 500.0000 mL | INTRAVENOUS | Status: DC | PRN

## 2014-01-12 MED ORDER — TERBUTALINE SULFATE 1 MG/ML IJ SOLN
0.2500 mg | Freq: Once | INTRAMUSCULAR | Status: AC | PRN
Start: 2014-01-12 — End: 2014-01-12

## 2014-01-12 MED ORDER — PHENYLEPHRINE 40 MCG/ML (10ML) SYRINGE FOR IV PUSH (FOR BLOOD PRESSURE SUPPORT)
80.0000 ug | PREFILLED_SYRINGE | INTRAVENOUS | Status: DC | PRN
  Filled 2014-01-12: qty 2

## 2014-01-12 MED ORDER — OXYTOCIN 40 UNITS IN LACTATED RINGERS INFUSION - SIMPLE MED: 62.5000 mL/h | INTRAVENOUS | Status: DC

## 2014-01-12 MED ORDER — OXYCODONE-ACETAMINOPHEN 5-325 MG PO TABS: 1.0000 | ORAL_TABLET | ORAL | Status: DC | PRN

## 2014-01-12 MED ORDER — BUTORPHANOL TARTRATE 1 MG/ML IJ SOLN: 1.0000 mg | INTRAMUSCULAR | Status: DC | PRN

## 2014-01-12 MED ORDER — ONDANSETRON HCL 4 MG/2ML IJ SOLN: 4.0000 mg | Freq: Four times a day (QID) | INTRAMUSCULAR | Status: DC | PRN

## 2014-01-12 MED ORDER — FLEET ENEMA 7-19 GM/118ML RE ENEM: 1.0000 | ENEMA | RECTAL | Status: DC | PRN

## 2014-01-12 MED ORDER — OXYTOCIN BOLUS FROM INFUSION: 500.0000 mL | INTRAVENOUS | Status: DC

## 2014-01-12 NOTE — Progress Notes (Signed)
Dr. Tamela OddiJackson Moore notified of patient arrival. Update Dr. Enrigue CatenaJ. Moore regarding FHR and contraction pattern. Orders received.

## 2014-01-12 NOTE — H&P (Signed)
Raven [name] is a 38 y.o. female presenting with decreased fetal movement Maternal Medical History:  Reason for admission: She c/o decreased fetal movement.  Contractions: Frequency: irregular.   Perceived severity is mild.    Fetal activity: Perceived fetal activity is decreased and recently changed.   Last perceived fetal movement was within the past 12 hours.    Prenatal Complications - Diabetes: none.    OB History    Gravida Para Term Preterm AB TAB SAB Ectopic Multiple Living   3 2 2       2      Past Medical History  Diagnosis Date  . Asthma   . 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 Breast surgery Bilateral 2012  . Knee arthroscopy Left 1992   Family History: family history is not on file. Social History:  reports that she has never smoked. She has never used smokeless tobacco. She reports that she does not drink alcohol or use illicit drugs.     Review of Systems  Constitutional: Negative for fever.  Eyes: Negative for blurred vision.  Respiratory: Negative for shortness of breath.   Gastrointestinal: Negative for vomiting.  Skin: Negative for rash.  Neurological: Negative for headaches.    Dilation: 3 Effacement (%): 70 Station: -2 Exam by:: Dr Tamela OddiJackson Moore  Blood pressure 123/79, pulse 104, temperature 97.8 F (36.6 C), temperature source Oral, resp. rate 18. Maternal Exam:  Abdomen: Patient reports no abdominal tenderness. Fetal presentation: vertex  Introitus: Normal vulva. Pelvis: adequate for delivery.   Cervix: Cervix evaluated by digital exam.     Fetal Exam Fetal Monitor Review: Baseline rate: 140.  Variability: moderate (6-25 bpm).   Pattern: accelerations present and no decelerations.    Fetal State Assessment: Category I - tracings are normal.     Physical Exam  Constitutional: She appears well-developed.  HENT:  Head:  Normocephalic.  Neck: Neck supple. No thyromegaly present.  Cardiovascular: Normal rate and regular rhythm.   Respiratory: Breath sounds normal.  GI: Soft. Bowel sounds are normal.  Skin: No rash noted.  AROM clear fluid  Prenatal labs: ABO, Rh: --/--/O POS (11/07 1835) Antibody: NEG (11/07 1835) Rubella: Immune (04/22 0000) RPR: NON REAC (08/05 0951)  HBsAg: Negative (04/22 0000)  HIV: NONREACTIVE (08/05 0951)  GBS: NOT DETECTED (10/09 1519)   Assessment/Plan: Multipara @ 7141w5d.  AMA, decreased fetal movement. Category I FHT  Admit Low dose Pitocin per protocol   JACKSON-MOORE,Donyell Carrell A 01/12/2014, 9:51 PM

## 2014-01-12 NOTE — Progress Notes (Signed)
Subjective:    Raven [name] is a 38 y.o. female being seen today for her obstetrical visit. She is at 546w3d gestation. Patient reports occasional contractions. Fetal movement: decreased.  Problem List Items Addressed This Visit    None    Visit Diagnoses    AMA (advanced maternal age) multigravida 35+, third trimester    -  Primary    Relevant Orders       POCT urinalysis dipstick (Completed)       Fetal non-stress test       US Fetal BPP W/O Non Stress (Completed)      Patient Active Problem List   Diagnosis Date Noted  . AMA (advanced maternal age) multigravida 35+   . [redacted] weeks gestation of pregnancy   . Elderly multigravida in second trimester 10/10/2013    Objective:    BP 118/73 mmHg  Pulse 71  Temp(Src) 97 F (36.1 C)  Wt 68.947 kg (152 lb) FHT: 140 BPM  Uterine Size: size equals dates  Presentations: cephalic  Pelvic Exam: Loose 1 cm/thick/membranes stripped   EFW by Leopold's : 3000 - 3300 g  Assessment:    Pregnancy @ 656w3d weeks  Minimally reactive NST, decreased fetal movement Plan:   BPP today Continue kick counts IOL scheduled in several days

## 2014-01-13 ENCOUNTER — Inpatient Hospital Stay (HOSPITAL_COMMUNITY): Payer: BC Managed Care – PPO | Admitting: Anesthesiology

## 2014-01-13 ENCOUNTER — Encounter (HOSPITAL_COMMUNITY): Payer: Self-pay | Admitting: Anesthesiology

## 2014-01-13 MED ORDER — SENNOSIDES-DOCUSATE SODIUM 8.6-50 MG PO TABS
2.0000 | ORAL_TABLET | ORAL | Status: DC
  Administered 2014-01-13: 2 via ORAL
  Filled 2014-01-13: qty 2

## 2014-01-13 MED ORDER — LANOLIN HYDROUS EX OINT: TOPICAL_OINTMENT | CUTANEOUS | Status: DC | PRN

## 2014-01-13 MED ORDER — MAGNESIUM HYDROXIDE 400 MG/5ML PO SUSP: 30.0000 mL | ORAL | Status: DC | PRN

## 2014-01-13 MED ORDER — DIPHENHYDRAMINE HCL 25 MG PO CAPS: 25.0000 mg | ORAL_CAPSULE | Freq: Four times a day (QID) | ORAL | Status: DC | PRN

## 2014-01-13 MED ORDER — DIBUCAINE 1 % RE OINT: 1.0000 "application " | TOPICAL_OINTMENT | RECTAL | Status: DC | PRN

## 2014-01-13 MED ORDER — ONDANSETRON HCL 4 MG/2ML IJ SOLN: 4.0000 mg | INTRAMUSCULAR | Status: DC | PRN

## 2014-01-13 MED ORDER — ZOLPIDEM TARTRATE 5 MG PO TABS: 5.0000 mg | ORAL_TABLET | Freq: Every evening | ORAL | Status: DC | PRN

## 2014-01-13 MED ORDER — MEASLES, MUMPS & RUBELLA VAC ~~LOC~~ INJ
0.5000 mL | INJECTION | Freq: Once | SUBCUTANEOUS | Status: DC
  Filled 2014-01-13: qty 0.5

## 2014-01-13 MED ORDER — OXYCODONE-ACETAMINOPHEN 5-325 MG PO TABS: 1.0000 | ORAL_TABLET | ORAL | Status: DC | PRN

## 2014-01-13 MED ORDER — DEXTROSE IN LACTATED RINGERS 5 % IV SOLN: INTRAVENOUS | Status: DC

## 2014-01-13 MED ORDER — LIDOCAINE HCL (PF) 1 % IJ SOLN
INTRAMUSCULAR | Status: DC | PRN
  Administered 2014-01-13 (×2): 4 mL

## 2014-01-13 MED ORDER — FENTANYL 2.5 MCG/ML BUPIVACAINE 1/10 % EPIDURAL INFUSION (WH - ANES)
INTRAMUSCULAR | Status: DC | PRN
  Administered 2014-01-13: 14 mL/h via EPIDURAL

## 2014-01-13 MED ORDER — LIDOCAINE-EPINEPHRINE (PF) 2 %-1:200000 IJ SOLN
INTRAMUSCULAR | Status: DC | PRN
  Administered 2014-01-13: 8 mL via EPIDURAL

## 2014-01-13 MED ORDER — BENZOCAINE-MENTHOL 20-0.5 % EX AERO
1.0000 "application " | INHALATION_SPRAY | CUTANEOUS | Status: DC | PRN
  Administered 2014-01-13: 1 via TOPICAL
  Filled 2014-01-13: qty 56

## 2014-01-13 MED ORDER — FERROUS SULFATE 325 (65 FE) MG PO TABS
325.0000 mg | ORAL_TABLET | Freq: Two times a day (BID) | ORAL | Status: DC
  Administered 2014-01-13 – 2014-01-14 (×3): 325 mg via ORAL
  Filled 2014-01-13 (×3): qty 1

## 2014-01-13 MED ORDER — IBUPROFEN 600 MG PO TABS
600.0000 mg | ORAL_TABLET | Freq: Four times a day (QID) | ORAL | Status: DC
  Administered 2014-01-13 – 2014-01-14 (×5): 600 mg via ORAL
  Filled 2014-01-13 (×5): qty 1

## 2014-01-13 MED ORDER — OXYCODONE-ACETAMINOPHEN 5-325 MG PO TABS: 2.0000 | ORAL_TABLET | ORAL | Status: DC | PRN

## 2014-01-13 MED ORDER — PRENATAL MULTIVITAMIN CH
1.0000 | ORAL_TABLET | Freq: Every day | ORAL | Status: DC
  Administered 2014-01-13 – 2014-01-14 (×2): 1 via ORAL
  Filled 2014-01-13 (×2): qty 1

## 2014-01-13 MED ORDER — ONDANSETRON HCL 4 MG PO TABS: 4.0000 mg | ORAL_TABLET | ORAL | Status: DC | PRN

## 2014-01-13 MED ORDER — WITCH HAZEL-GLYCERIN EX PADS: 1.0000 "application " | MEDICATED_PAD | CUTANEOUS | Status: DC | PRN

## 2014-01-13 MED ORDER — TETANUS-DIPHTH-ACELL PERTUSSIS 5-2.5-18.5 LF-MCG/0.5 IM SUSP: 0.5000 mL | Freq: Once | INTRAMUSCULAR | Status: DC

## 2014-01-13 NOTE — Progress Notes (Signed)
Raven [name] is [name] 38 y.o. G3P2002 at 5871w6d by LMP admitted for induction of labor due to decreased fetal movement.  Subjective: Comfortable  Objective: BP 118/68 mmHg  Pulse 88  Temp(Src) 98.4 F (36.9 C) (Oral)  Resp 16  Ht 5\' 5"  (1.651 m)  Wt 68.947 kg (152 lb)  BMI 25.29 kg/m2  SpO2 100%   Total I/O In: -  Out: 150 [Urine:150]  FHT:  FHR: 140 bpm, variability: moderate,  accelerations:  Present,  decelerations:  Absent UC:   regular, every 2 minutes SVE:   Dilation: 7 Effacement (%): 80 Station: 0 ROT-->ROP; no molding or caput  Labs: Lab Results  Component Value Date   WBC 8.8 01/12/2014   HGB 10.3* 01/12/2014   HCT 31.4* 01/12/2014   MCV 81.6 01/12/2014   PLT 156 01/12/2014    Assessment / Plan: Induction of labor due to decreased fetal movement,  progressing well on pitocin  Labor: Progressing normally Preeclampsia:  n/[name] Fetal Wellbeing:  Category I Pain Control:  Epidural I/D:  n/[name] Anticipated MOD:  NSVD  JACKSON-MOORE,Raven [name] 01/13/2014, 3:35 AM

## 2014-01-13 NOTE — Lactation Note (Signed)
This note was copied from the chart of Raven Adolphus Birchwoodmma Mesenbrink. Lactation Consultation Note  Initial visit made.  Mom is currently holding baby on chest skin to skin and baby is sleeping.  Mom states she has experience breastfeeding her first two babies.  She had some nipple soreness in the beginning which resolved.  Mom states newborn is breastfeeding well with good latch.  Reviewed breastfeeding basics and answered questions.  Instructed to feed baby with any feeding cue.  Mom has has a breast augmentation since last baby.  Surgical approach was in the axilla area.  Reassured mom this is usually not a problem with lactation.  Breastfeeding consultation services and support information reviewed and left with patient.   Encouraged to call with concerns/assist prn.  Patient Name: Raven [name] ZOXWR'UToday's Date: 01/13/2014 Reason for consult: Initial assessment;Breast surgery   Maternal Data    Feeding Feeding Type: Unknown Length of feed: 30 min  LATCH Score/Interventions                      Lactation Tools Discussed/Used     Consult Status Consult Status: Follow-up Date: 01/14/14 Follow-up type: In-patient    Huston FoleyMOULDEN, Magdalyn Arenivas S 01/13/2014, 2:15 PM

## 2014-01-13 NOTE — Plan of Care (Signed)
Problem: Phase II Progression Outcomes Goal: Pain controlled on oral analgesia Outcome: Completed/Met Date Met:  01/13/14 Goal: Progress activity as tolerated unless otherwise ordered Outcome: Completed/Met Date Met:  01/13/14

## 2014-01-13 NOTE — Anesthesia Procedure Notes (Signed)
Epidural Patient location during procedure: OB Start time: 01/13/2014 1:09 AM  Staffing Anesthesiologist: Maricus Tanzi A. Performed by: anesthesiologist   Preanesthetic Checklist Completed: patient identified, site marked, surgical consent, pre-op evaluation, timeout performed, IV checked, risks and benefits discussed and monitors and equipment checked  Epidural Patient position: sitting Prep: site prepped and draped and DuraPrep Patient monitoring: continuous pulse ox and blood pressure Approach: midline Location: L3-L4 Injection technique: LOR air  Needle:  Needle type: Tuohy  Needle gauge: 17 G Needle length: 9 cm and 9 Needle insertion depth: 4 cm Catheter type: closed end flexible Catheter size: 19 Gauge Catheter at skin depth: 9 cm Test dose: negative and Other  Assessment Events: blood not aspirated, injection not painful, no injection resistance, negative IV test and no paresthesia  Additional Notes Patient identified. Risks and benefits discussed including failed block, incomplete  Pain control, post dural puncture headache, nerve damage, paralysis, blood pressure Changes, nausea, vomiting, reactions to medications-both toxic and allergic and post Partum back pain. All questions were answered. Patient expressed understanding and wished to proceed. Sterile technique was used throughout procedure. Epidural site was Dressed with sterile barrier dressing. No paresthesias, signs of intravascular injection Or signs of intrathecal spread were encountered.  Patient was more comfortable after the epidural was dosed. Please see RN's note for documentation of vital signs and FHR which are stable.

## 2014-01-13 NOTE — Plan of Care (Signed)
Problem: Phase II Progression Outcomes Goal: Afebrile, VS remain stable Outcome: Completed/Met Date Met:  01/13/14 Goal: Tolerating diet Outcome: Completed/Met Date Met:  01/13/14  Problem: Discharge Progression Outcomes Goal: Barriers To Progression Addressed/Resolved Outcome: Completed/Met Date Met:  01/13/14 Goal: Activity appropriate for discharge plan Outcome: Completed/Met Date Met:  01/13/14 Goal: Tolerating diet Outcome: Completed/Met Date Met:  16/55/37 Goal: Complications resolved/controlled Outcome: Completed/Met Date Met:  01/13/14 Goal: Pain controlled with appropriate interventions Outcome: Completed/Met Date Met:  01/13/14 Goal: Afebrile, VS remain stable at discharge Outcome: Completed/Met Date Met:  01/13/14 Goal: Remove staples per MD order Outcome: Not Applicable Date Met:  48/27/07

## 2014-01-13 NOTE — Anesthesia Preprocedure Evaluation (Signed)
Anesthesia Evaluation  Patient identified by MRN, date of birth, ID band Patient awake    Reviewed: Allergy & Precautions, H&P , Patient's Chart, lab work & pertinent test results  Airway Mallampati: II  TM Distance: >3 FB Neck ROM: Full    Dental no notable dental hx. (+) Teeth Intact   Pulmonary asthma ,  breath sounds clear to auscultation  Pulmonary exam normal       Cardiovascular negative cardio ROS  Rhythm:Regular Rate:Normal     Neuro/Psych Anatomical borderline narrow angle glaucoma negative psych ROS   GI/Hepatic negative GI ROS, Neg liver ROS,   Endo/Other  negative endocrine ROS  Renal/GU negative Renal ROS  negative genitourinary   Musculoskeletal negative musculoskeletal ROS (+)   Abdominal   Peds  Hematology negative hematology ROS (+)   Anesthesia Other Findings   Reproductive/Obstetrics (+) Pregnancy                             Anesthesia Physical Anesthesia Plan  ASA: II  Anesthesia Plan: Epidural   Post-op Pain Management:    Induction:   Airway Management Planned: Natural Airway  Additional Equipment:   Intra-op Plan:   Post-operative Plan:   Informed Consent: I have reviewed the patients History and Physical, chart, labs and discussed the procedure including the risks, benefits and alternatives for the proposed anesthesia with the patient or authorized representative who has indicated his/her understanding and acceptance.     Plan Discussed with: Anesthesiologist  Anesthesia Plan Comments:         Anesthesia Quick Evaluation

## 2014-01-13 NOTE — Anesthesia Postprocedure Evaluation (Signed)
  Anesthesia Post-op Note  Patient: Raven [name]  Procedure(s) Performed: * No procedures listed *  Patient Location: Mother/Baby  Anesthesia Type:Epidural  Level of Consciousness: awake and alert   Airway and Oxygen Therapy: Patient Spontanous Breathing  Post-op Pain: mild  Post-op Assessment: Post-op Vital signs reviewed, Patient's Cardiovascular Status Stable, Respiratory Function Stable, No signs of Nausea or vomiting, Pain level controlled, No headache and No residual motor weakness  Post-op Vital Signs: Reviewed  Last Vitals:  Filed Vitals:   01/13/14 1200  BP: 116/63  Pulse: 77  Temp: 36.9 C  Resp: 18    Complications: No apparent anesthesia complications

## 2014-01-13 NOTE — Plan of Care (Signed)
Problem: Phase I Progression Outcomes Goal: Pain controlled with appropriate interventions Outcome: Completed/Met Date Met:  01/13/14 Goal: VS, stable, temp < 100.4 degrees F Outcome: Completed/Met Date Met:  01/13/14 Goal: Initial discharge plan identified Outcome: Completed/Met Date Met:  01/13/14

## 2014-01-13 NOTE — Plan of Care (Signed)
Problem: Phase I Progression Outcomes Goal: Voiding adequately Outcome: Completed/Met Date Met:  01/13/14 Goal: OOB as tolerated unless otherwise ordered Outcome: Completed/Met Date Met:  01/13/14     

## 2014-01-14 ENCOUNTER — Inpatient Hospital Stay (HOSPITAL_COMMUNITY): Admission: RE | Admit: 2014-01-14 | Payer: BC Managed Care – PPO | Source: Ambulatory Visit

## 2014-01-14 DIAGNOSIS — O9903 Anemia complicating the puerperium: Secondary | ICD-10-CM

## 2014-01-14 LAB — CBC
HCT: 25.1 % — ABNORMAL LOW (ref 36.0–46.0)
HEMOGLOBIN: 8.4 g/dL — AB (ref 12.0–15.0)
MCH: 27.5 pg (ref 26.0–34.0)
MCHC: 33.5 g/dL (ref 30.0–36.0)
MCV: 82.3 fL (ref 78.0–100.0)
PLATELETS: 121 10*3/uL — AB (ref 150–400)
RBC: 3.05 MIL/uL — AB (ref 3.87–5.11)
RDW: 15.9 % — ABNORMAL HIGH (ref 11.5–15.5)
WBC: 8.4 10*3/uL (ref 4.0–10.5)

## 2014-01-14 LAB — RPR

## 2014-01-14 NOTE — Lactation Note (Signed)
This note was copied from the chart of Raven [name] Hojnacki. Lactation Consultation Note  P3, Mother states breastfeeding going well.  She is slightly tender. Provided mother with comfort gels. Reviewed engorgement care. Mom encouraged to feed baby 8-12 times/24 hours and with feeding cues.    Patient Name: Raven [name] Bango WUJWJ'XToday's Date: 01/14/2014 Reason for consult: Follow-up assessment   Maternal Data    Feeding Feeding Type: Breast Fed Length of feed: 30 min  LATCH Score/Interventions                      Lactation Tools Discussed/Used     Consult Status Consult Status: Complete    Hardie PulleyBerkelhammer, Lupita Rosales Boschen 01/14/2014, 9:37 AM

## 2014-01-14 NOTE — Progress Notes (Signed)
Patient ID: Raven [name], female   DOB: 28-Mar-1975, 38 y.o.   MRN: [id number] Post Partum Day 1 S/P spontaneous vaginal RH status/Rubella reviewed.  Feeding: breast Subjective: No HA, SOB, CP, F/C, breast symptoms. Normal vaginal bleeding, no clots.     Objective: BP 109/59 mmHg  Pulse 75  Temp(Src) 98 F (36.7 C) (Oral)  Resp 18  Ht 5\' 5"  (1.651 m)  Wt 68.947 kg (152 lb)  BMI 25.29 kg/m2  SpO2 99%  Breastfeeding? Unknown   Physical Exam:  General: alert Lochia: appropriate Uterine Fundus: firm DVT Evaluation: No evidence of DVT seen on physical exam. Ext: No c/c/e  Recent Labs  01/12/14 1835 01/14/14 0620  HGB 10.3* 8.4*  HCT 31.4* 25.1*      Assessment/Plan: 38 y.o.  PPD #1 .  normal postpartum exam Anemia stable Continue current postpartum care Ambulate   LOS: 2 days   JACKSON-MOORE,Lovelle Deitrick A 01/14/2014, 9:19 AM

## 2014-01-14 NOTE — Plan of Care (Signed)
Problem: Discharge Progression Outcomes Goal: Discharge plan in place and appropriate Outcome: Completed/Met Date Met:  01/14/14

## 2014-01-14 NOTE — Discharge Instructions (Signed)

## 2014-01-14 NOTE — Discharge Summary (Signed)
  Obstetric Discharge Summary Reason for Admission: induction of labor Prenatal Procedures: none Intrapartum Procedures: spontaneous vaginal delivery Postpartum Procedures: none Complications-Operative and Postpartum: none  HEMOGLOBIN  Date Value Ref Range Status  01/14/2014 8.4* 12.0 - 15.0 g/dL Final    Comment:    REPEATED TO VERIFY DELTA CHECK NOTED   06/27/2013 11.9 g/dL Final   HCT  Date Value Ref Range Status  01/14/2014 25.1* 36.0 - 46.0 % Final  06/27/2013 35 % Final    Physical Exam:  General: alert Lochia: appropriate Uterine: firm Incision: n/a DVT Evaluation: No evidence of DVT seen on physical exam.  Discharge Diagnoses: Active Problems:   Decreased fetal movement during pregnancy in third trimester, antepartum   Normal vaginal delivery   Anemia of mother, with delivery, with postpartum complication   Discharge Information: Date: 01/14/2014 Activity: pelvic rest Diet: routine Medications:  Prior to Admission medications   Medication Sig Start Date End Date Taking? Authorizing Provider  acetaminophen (TYLENOL) 500 MG tablet Take 1,000 mg by mouth every 6 (six) hours as needed for mild pain.   Yes Historical Provider, MD  Prenatal Vit-Fe Fumarate-FA (MULTIVITAMIN-PRENATAL) 27-0.8 MG TABS tablet Take 1 tablet by mouth daily.    Yes Historical Provider, MD    Condition: stable Instructions: refer to routine discharge instructions Discharge to: home Follow-up Information    Follow up with Antionette CharJACKSON-MOORE,Rhonna Holster A, MD. Schedule an appointment as soon as possible for a visit in 6 weeks.   Specialty:  Obstetrics and Gynecology   Contact information:   [address] [place] [address] [phone]       Newborn Data:  Live born female  Birth Weight: 8 lb 5 oz (3771 g) APGAR: 8, 9   Home with mother.  JACKSON-MOORE,Junaid Wurzer A 01/14/2014, 1:03 PM

## 2014-01-15 NOTE — Progress Notes (Signed)
Order(s) created erroneously. Erroneous order ID: [id number]  Order moved by: Ian MalkinEVERHART, Kenedee Molesky F  Order move date/time: 01/15/2014 4:11 PM  Source Patient: [id number]  Source Contact: 01/12/2014  Destination Patient: [id number]  Destination Contact: 05/23/2012

## 2014-01-15 NOTE — Progress Notes (Signed)
Order(s) created erroneously. Erroneous order ID: [id number]  Order moved by: Ian MalkinEVERHART, Shanie Mauzy F  Order move date/time: 01/15/2014 4:13 PM  Source Patient: [id number]  Source Contact: 01/12/2014  Destination Patient: [id number]  Destination Contact: 05/23/2012

## 2014-01-15 NOTE — Progress Notes (Signed)
Order(s) created erroneously. Erroneous order ID: [id number]  Order moved by: Eleanora Guinyard F  Order move date/time: 01/15/2014 4:19 PM  Source Patient: [id number]  Source Contact: 01/12/2014  Destination Patient: [id number]  Destination Contact: 05/23/2012 

## 2014-01-15 NOTE — Progress Notes (Signed)
Order(s) created erroneously. Erroneous order ID: [id number]  Order moved by: Ian MalkinEVERHART, Haydan Wedig F  Order move date/time: 01/15/2014 4:13 PM  Source Patient: [id number]  Source Contact: 01/12/2014  Destination Patient: [id number]  Destination Contact: 05/23/2012

## 2014-01-15 NOTE — Progress Notes (Signed)
Order(s) created erroneously. Erroneous order ID: [id number]  Order moved by: Loura Pitt F  Order move date/time: 01/15/2014 4:16 PM  Source Patient: [id number]  Source Contact: 01/12/2014  Destination Patient: [id number]  Destination Contact: 05/23/2012 

## 2014-01-15 NOTE — Progress Notes (Signed)
Order(s) created erroneously. Erroneous order ID: [id number]  Order moved by: Crosby Oriordan F  Order move date/time: 01/15/2014 4:14 PM  Source Patient: [id number]  Source Contact: 01/12/2014  Destination Patient: [id number]  Destination Contact: 05/23/2012 

## 2014-01-15 NOTE — Progress Notes (Signed)
Order(s) created erroneously. Erroneous order ID: [id number]  Order moved by: Ian MalkinEVERHART, Noga Fogg F  Order move date/time: 01/15/2014 4:18 PM  Source Patient: [id number]  Source Contact: 01/12/2014  Destination Patient: [id number]  Destination Contact: 05/23/2012

## 2014-01-15 NOTE — Progress Notes (Signed)
Order(s) created erroneously. Erroneous order ID: [id number]  Order moved by: Ian MalkinEVERHART, Adebayo Ensminger F  Order move date/time: 01/15/2014 4:17 PM  Source Patient: [id number]  Source Contact: 01/12/2014  Destination Patient: [id number]  Destination Contact: 05/23/2012

## 2014-01-15 NOTE — Progress Notes (Signed)
Order(s) created erroneously. Erroneous order ID: [id number]  Order moved by: Lurline Caver F  Order move date/time: 01/15/2014 4:15 PM  Source Patient: [id number]  Source Contact: 01/12/2014  Destination Patient: [id number]  Destination Contact: 05/23/2012 

## 2014-03-04 ENCOUNTER — Encounter: Payer: Self-pay | Admitting: *Deleted

## 2014-03-05 ENCOUNTER — Encounter: Payer: Self-pay | Admitting: Obstetrics & Gynecology

## 2014-07-19 ENCOUNTER — Other Ambulatory Visit: Payer: Self-pay | Admitting: Gynecologic Oncology

## 2014-07-19 MED ORDER — ALBUTEROL SULFATE HFA 108 (90 BASE) MCG/ACT IN AERS: 2.0000 | INHALATION_SPRAY | Freq: Four times a day (QID) | RESPIRATORY_TRACT | Status: AC | PRN

## 2014-07-19 MED ORDER — ZOLPIDEM TARTRATE 5 MG PO TABS: 5.0000 mg | ORAL_TABLET | Freq: Every evening | ORAL | Status: DC | PRN

## 2014-10-30 ENCOUNTER — Other Ambulatory Visit: Payer: Self-pay | Admitting: Gynecologic Oncology

## 2014-10-30 DIAGNOSIS — N390 Urinary tract infection, site not specified: Secondary | ICD-10-CM

## 2014-10-30 MED ORDER — PHENAZOPYRIDINE HCL 95 MG PO TABS: 95.0000 mg | ORAL_TABLET | Freq: Three times a day (TID) | ORAL | Status: AC | PRN

## 2014-10-30 MED ORDER — CIPROFLOXACIN HCL 250 MG PO TABS: 250.0000 mg | ORAL_TABLET | Freq: Two times a day (BID) | ORAL | Status: DC

## 2014-11-25 ENCOUNTER — Ambulatory Visit: Payer: BC Managed Care – PPO

## 2014-11-25 ENCOUNTER — Other Ambulatory Visit: Payer: Self-pay | Admitting: Gynecologic Oncology

## 2014-11-25 DIAGNOSIS — R300 Dysuria: Secondary | ICD-10-CM

## 2014-11-27 LAB — URINE CULTURE

## 2014-11-28 ENCOUNTER — Other Ambulatory Visit: Payer: Self-pay | Admitting: Gynecologic Oncology

## 2014-11-28 NOTE — Progress Notes (Signed)
Augmentin called in to [facility] in [place] for UTI.

## 2016-02-20 IMAGING — US US OB DETAIL+14 WK
1 series · 12 of 28 positions shown · non-contrast
Comparison: none

[Series 1: us ob detail+14 wk · 0.12mm/px · 12 of 80 slices shown]
[im 3/80]
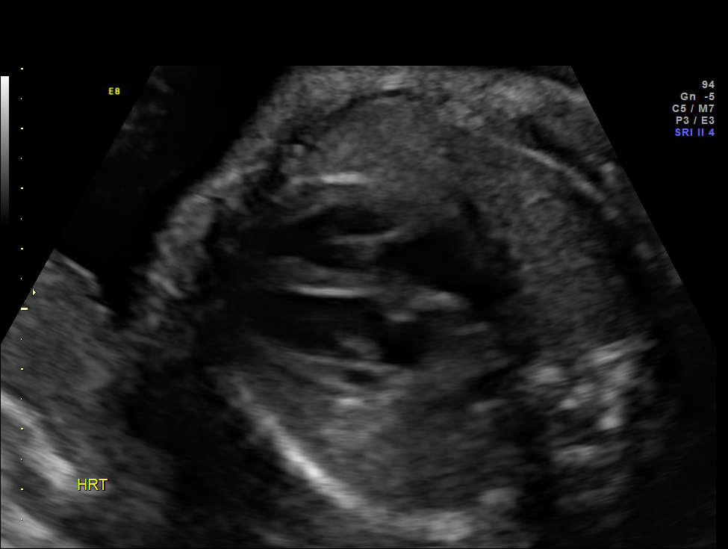
[im 9/80]
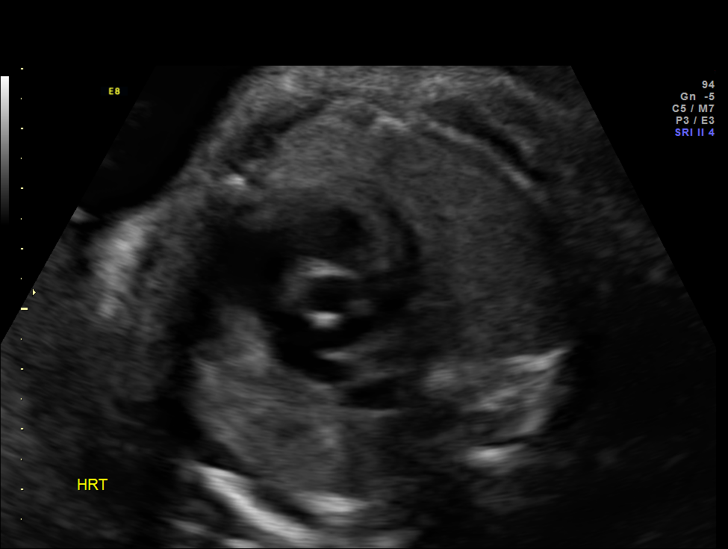
[im 15/80]
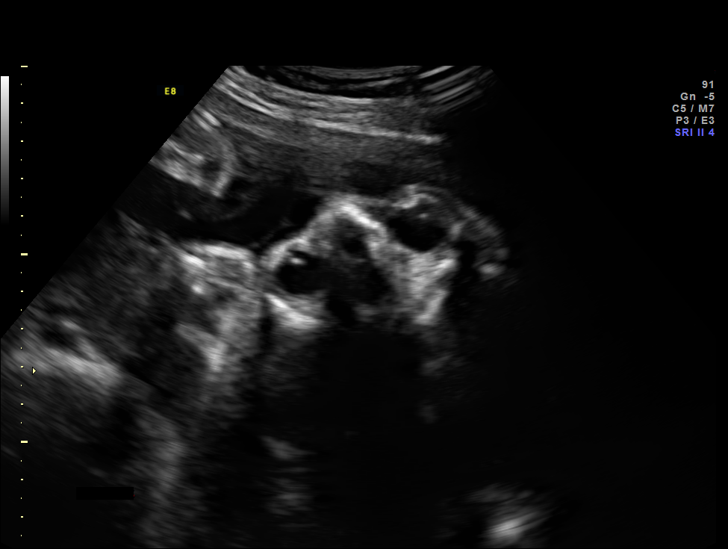
[im 24/80]
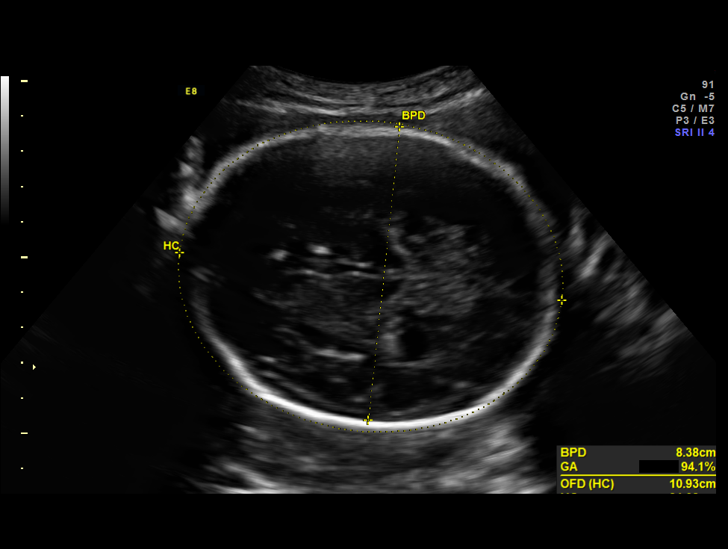
[im 30/80]
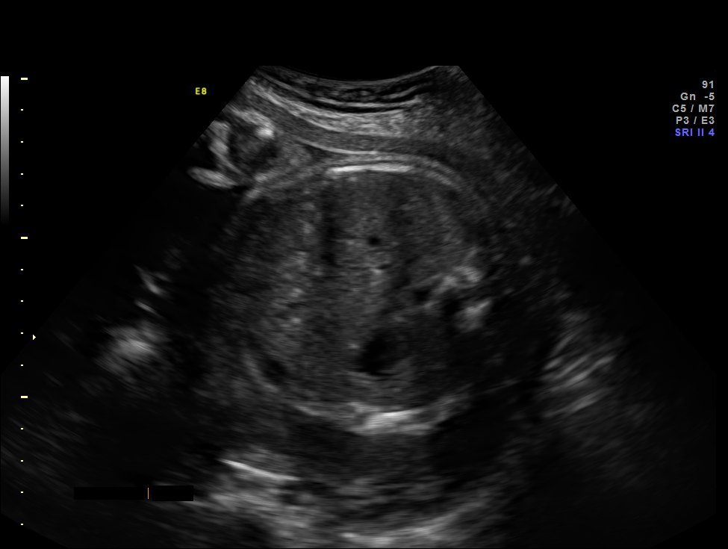
[im 36/80]
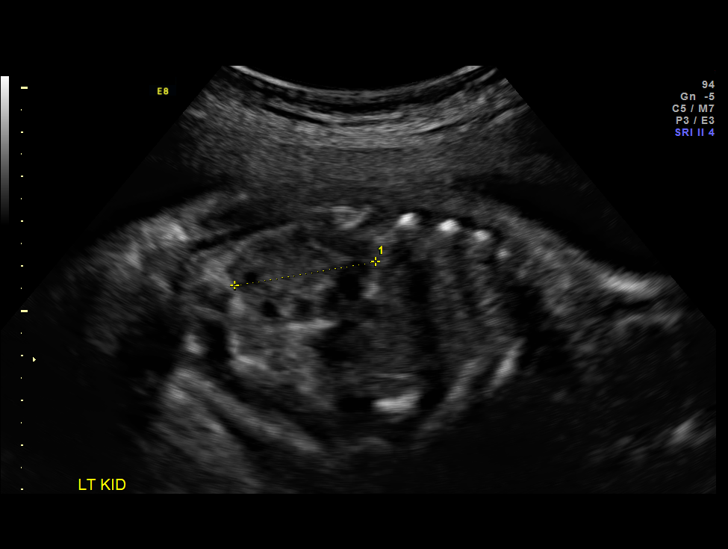
[im 44/80]
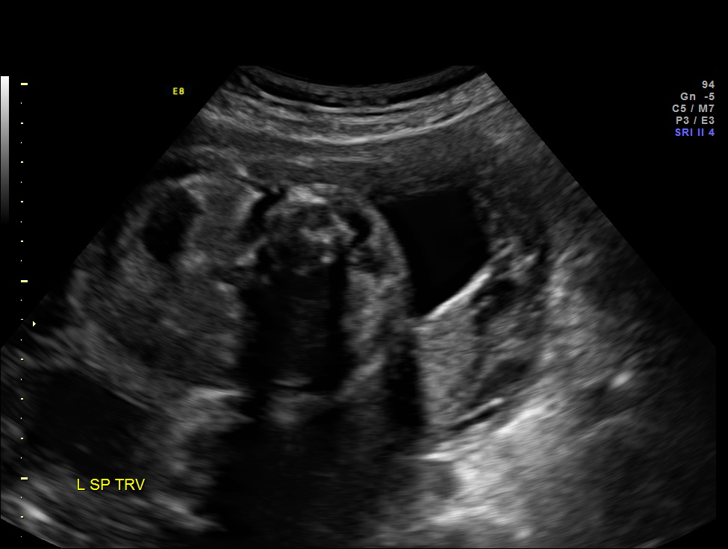
[im 50/80]
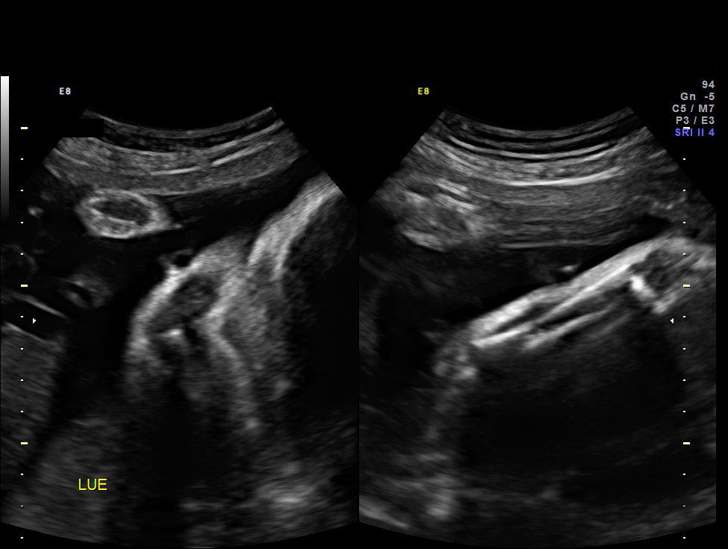
[im 56/80]
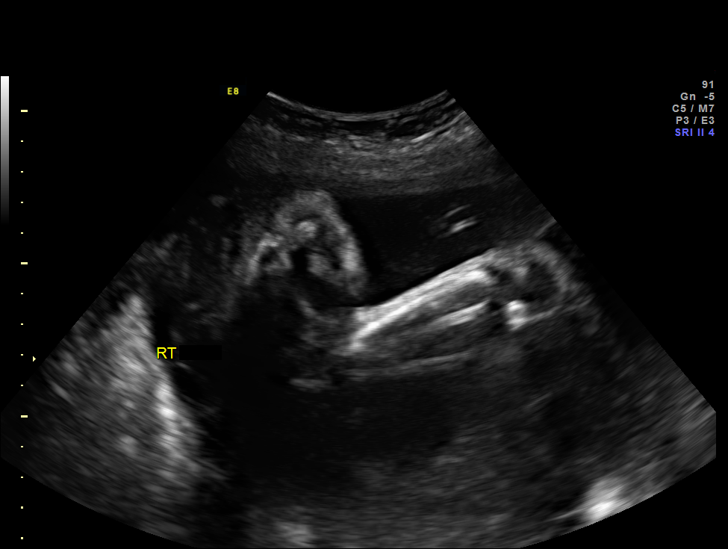
[im 65/80]
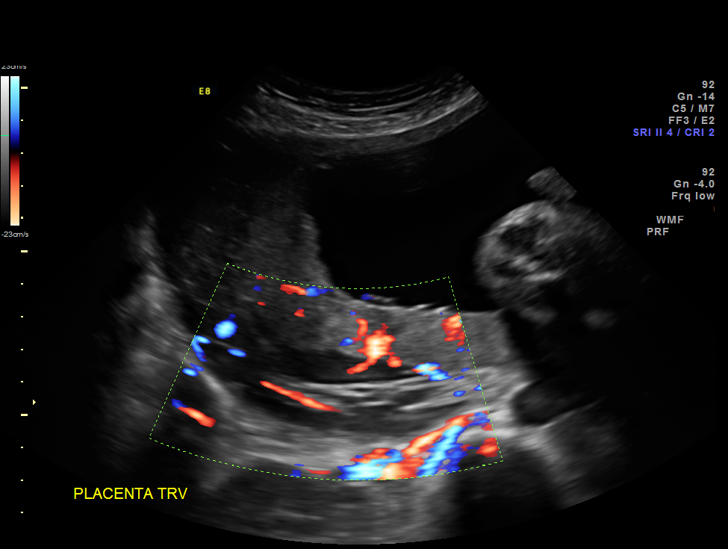
[im 71/80]
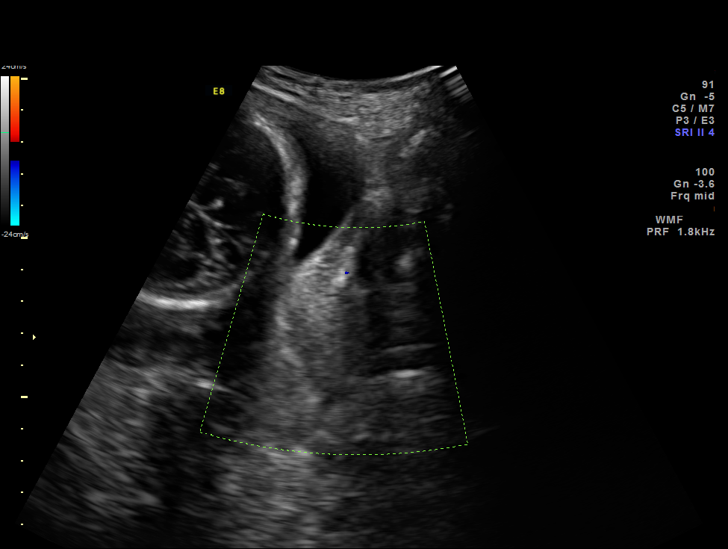
[im 77/80]
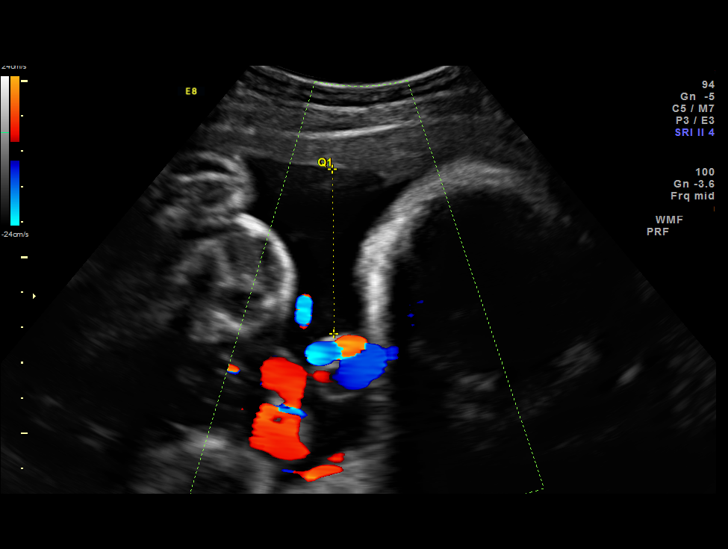

[12 of 28 positions shown; findings below may reference images not displayed]

OBSTETRICS REPORT
                      (Signed Final 11/15/2013 [DATE])

Service(s) Provided

 US OB DETAIL + 14 WK                                  76811.0
Indications

 Detailed fetal anatomic survey
 Advanced maternal age (37), Multigravida
Fetal Evaluation

 Num Of Fetuses:    1
 Fetal Heart Rate:  171                          bpm
 Cardiac Activity:  Observed
 Presentation:      Cephalic
 Placenta:          Posterior, above cervical
                    os
 P. Cord            Visualized, central
 Insertion:

 Amniotic Fluid
 AFI FV:      Subjectively within normal limits
 AFI Sum:     17.29   cm       64  %Tile     Larg Pckt:    4.67  cm
 RUQ:   4.67    cm   RLQ:    4.22   cm    LUQ:   4.67    cm   LLQ:    3.73   cm
Biometry

 BPD:     83.5  mm     G. Age:  33w 4d                CI:         76.6   70 - 86
 OFD:      109  mm                                    FL/HC:      18.8   19.3 -

 HC:       309  mm     G. Age:  34w 4d       90  %    HC/AC:      1.06   0.96 -

 AC:     291.1  mm     G. Age:  33w 1d       89  %    FL/BPD:     69.7   71 - 87
 FL:      58.2  mm     G. Age:  30w 3d       14  %    FL/AC:      20.0   20 - 24
 HUM:     54.1  mm     G. Age:  31w 3d       53  %
 CER:     39.6  mm     G. Age:  33w 4d       82  %

 Est. FW:    2393  gm      4 lb 6 oz     76  %
Gestational Age

 U/S Today:     32w 6d                                        EDD:   01/04/14
 Best:          31w 3d     Det. By:  Early Ultrasound         EDD:   01/14/14
                                     (06/25/13)
Anatomy
 Cranium:          Appears normal         Aortic Arch:      Appears normal
 Fetal Cavum:      Appears normal         Ductal Arch:      Not well visualized
 Ventricles:       Appears normal         Diaphragm:        Appears normal
 Choroid Plexus:   Appears normal         Stomach:          Appears normal, left
                                                            sided
 Cerebellum:       Appears normal         Abdomen:          Appears normal
 Posterior Fossa:  Appears normal         Abdominal Wall:   Appears nml (cord
                                                            insert, abd wall)
 Nuchal Fold:      Not applicable (>20    Cord Vessels:     Appears normal (3
                   wks GA)                                  vessel cord)
 Face:             Appears normal         Kidneys:          Appear normal
                   (orbits and profile)
 Lips:             Appears normal         Bladder:          Appears normal
 Heart:            Appears normal         Spine:            Appears normal
                   (4CH, axis, and
                   situs)
 RVOT:             Appears normal         Lower             Visualized
                                          Extremities:
 LVOT:             Appears normal         Upper             Visualized
                                          Extremities:

 Other:  Fetus appears to be a male. Heels visualized. Nasal bone visualized.
         Technically difficult due to advanced GA and fetal position.
Targeted Anatomy

 Fetal Central Nervous System
 Cisterna Magna:
Cervix Uterus Adnexa

 Cervix:       Not visualized (advanced GA >33wks)
 Left Ovary:    Within normal limits.
 Right Ovary:   Within normal limits.

 Adnexa:     No abnormality visualized.
Impression

 Single IUP at 31w 3d
 Advanced maternal age- NIPS low risk for fetal aneuploidy
 Normal fetal anatomic survey
 Fetal growth is appropriate (76th %tile)
 Posterior placenta without previa
 Normal amniotic fluid volume
Recommendations

 Follow-up ultrasounds as clinically indicated.
 Recommend antepartum fetal testing (NSTs) beginning at 36
 weeks gestation due to advanced maternal age and delivery
 by EDD in the absence of other complications.

 questions or concerns.

## 2016-04-16 IMAGING — US US FETAL BPP W/O NONSTRESS
1 series · 14 of 20 positions shown · non-contrast
Comparison: none

[Series 1: us fetal bpp w/o nonstress · non-contrast · 20 acquisitions, 14 frames shown]
[im 1/20]
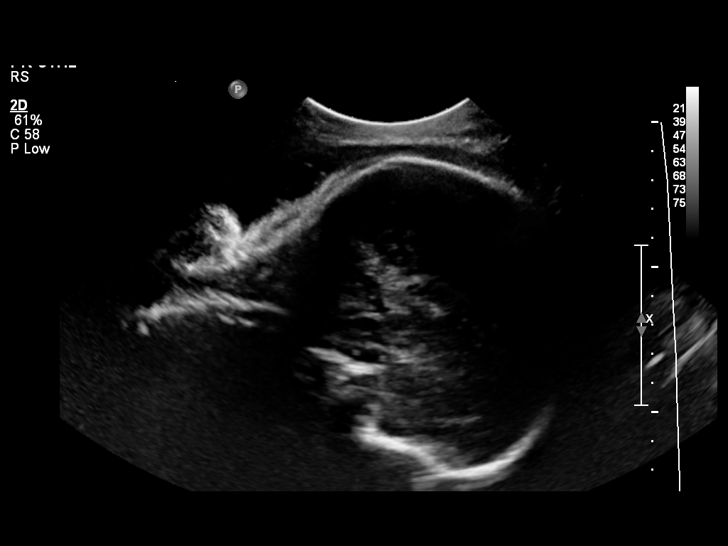
[im 3/20]
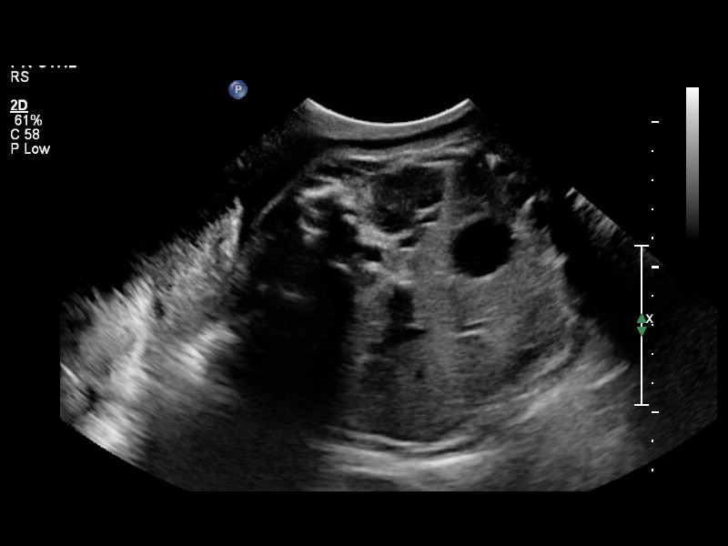
[im 4/20]
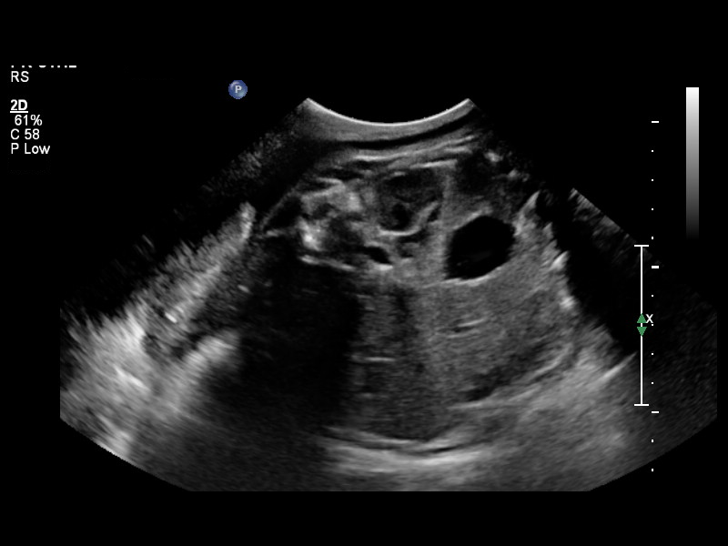
[im 6/20]
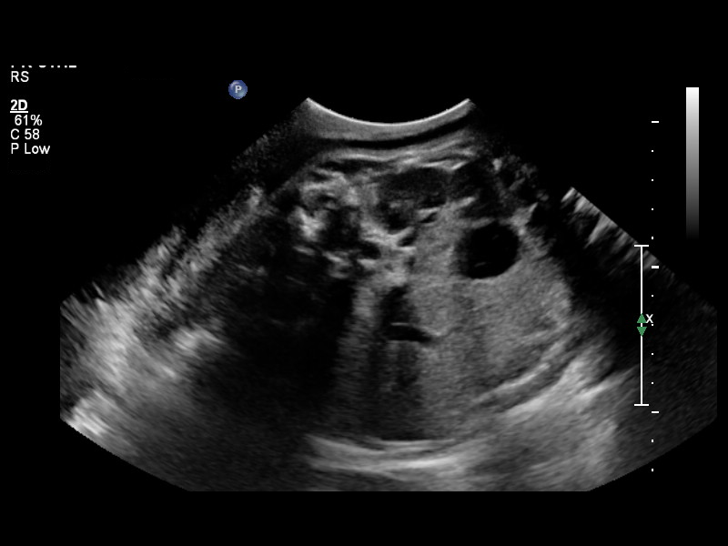
[im 7/20]
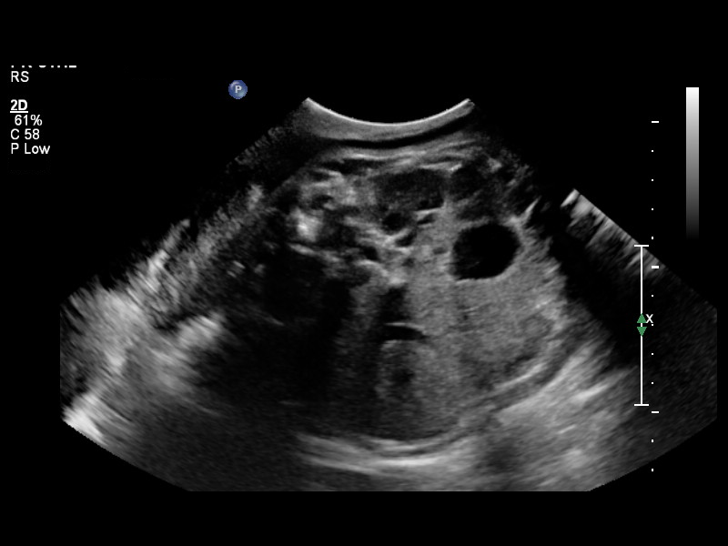
[im 8/20]
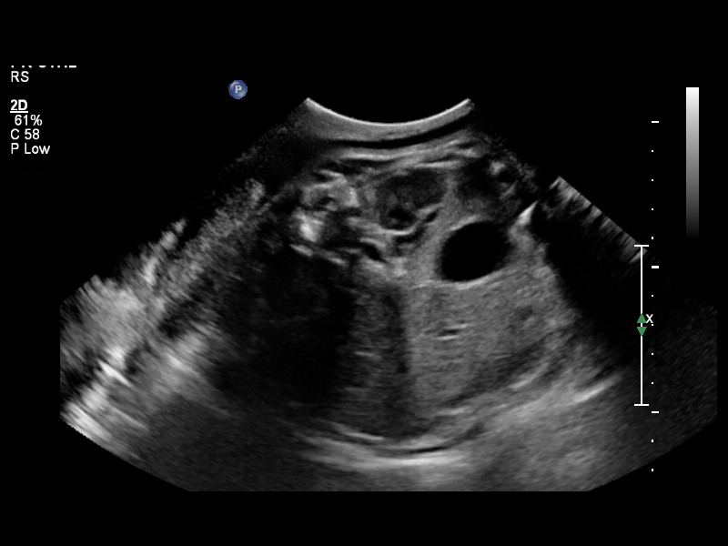
[im 10/20]
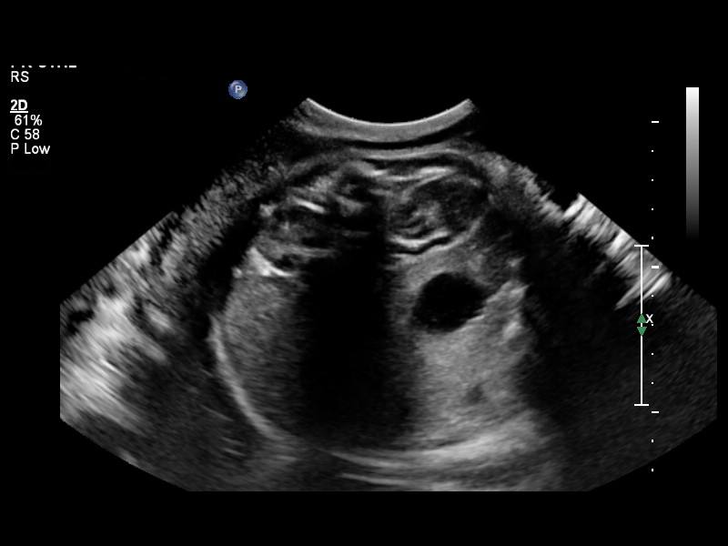
[im 11/20]
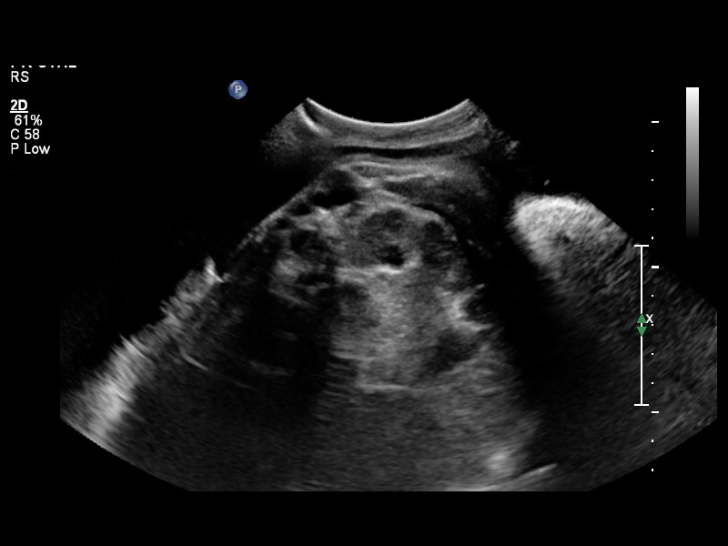
[im 13/20]
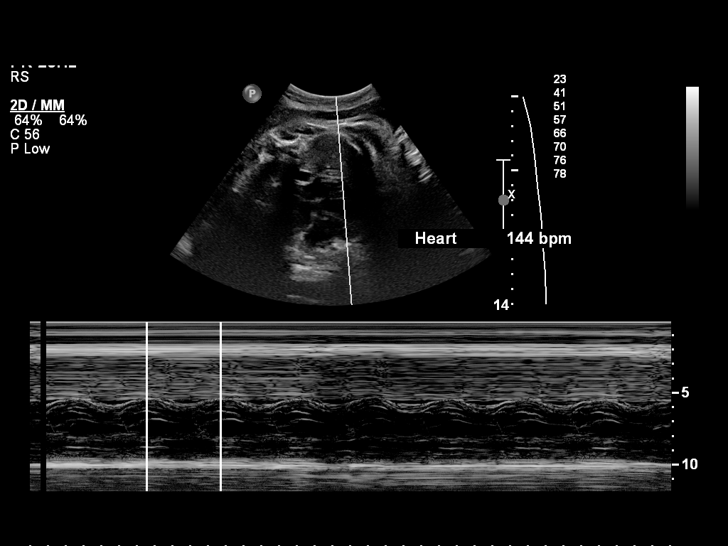
[im 14/20]
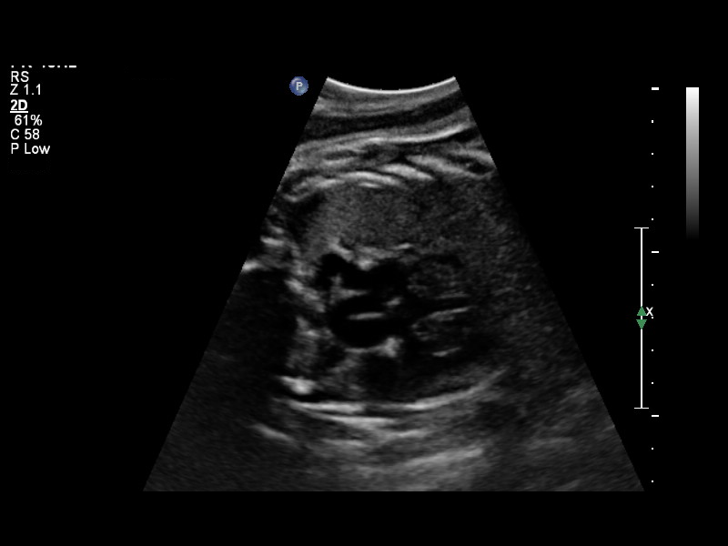
[im 16/20]
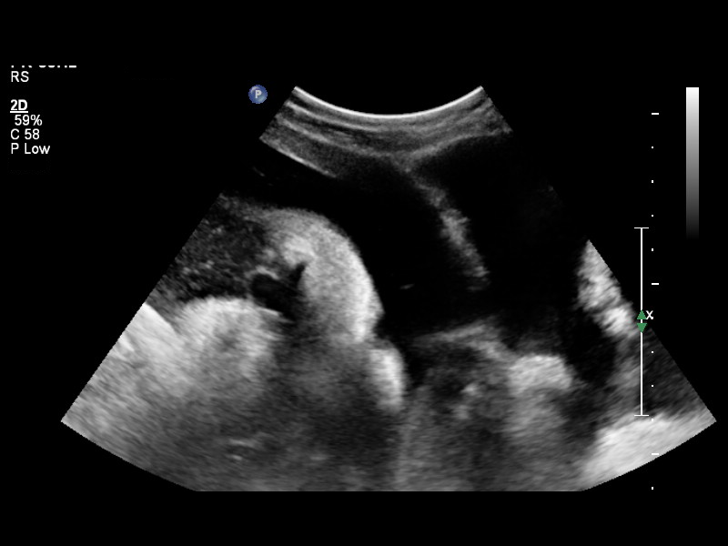
[im 17/20]
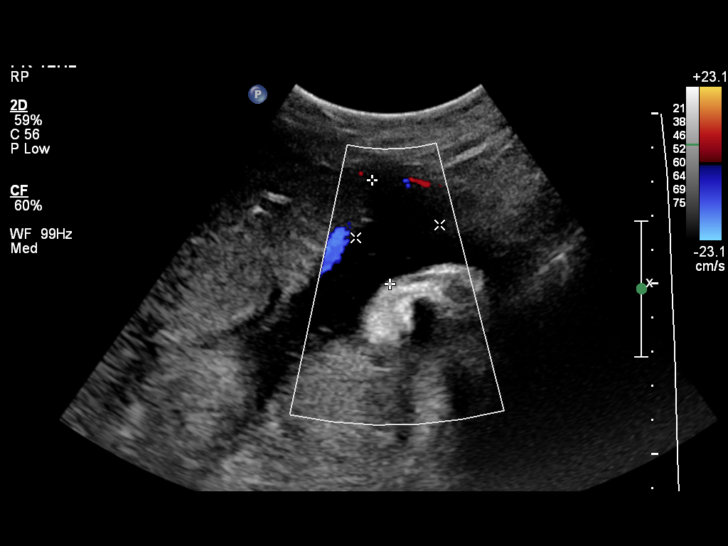
[im 18/20]
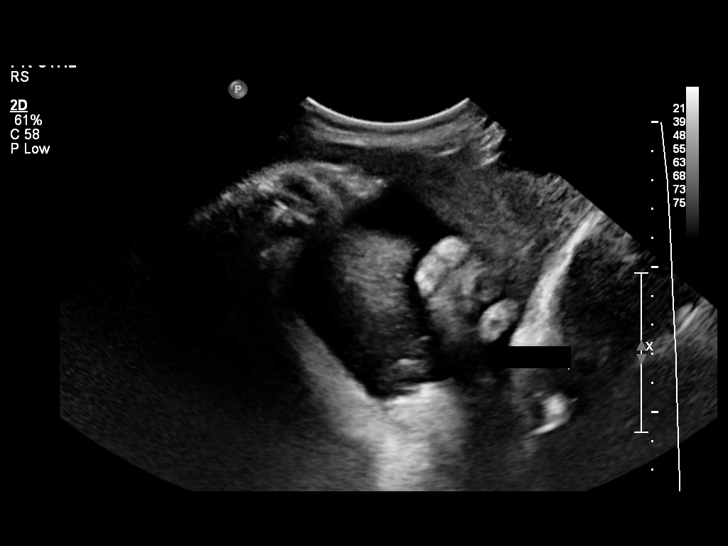
[im 20/20]
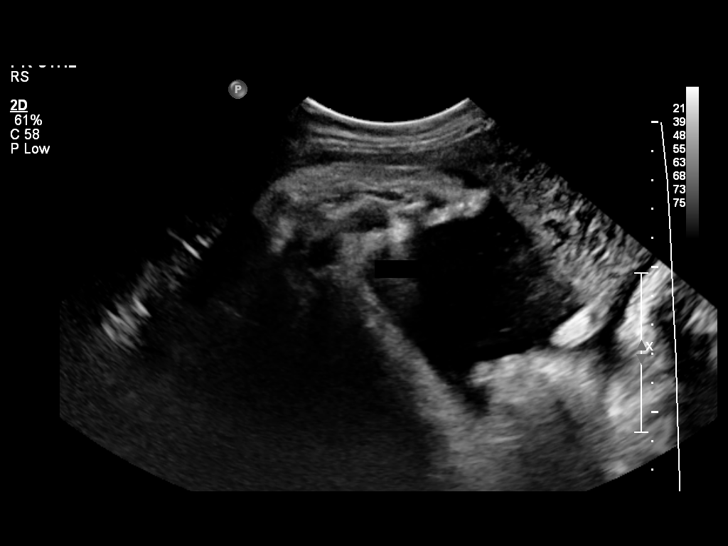

[14 of 20 positions shown; findings below may reference images not displayed]

OBSTETRICS REPORT

Service(s) Provided

Indications

 39 weeks gestation of pregnancy
 Advanced maternal age primigravida 35+, third
 trimester; low risk NIPS
Fetal Evaluation

 Num Of Fetuses:    1
 Fetal Heart Rate:  144                          bpm
 Cardiac Activity:  Observed
 Presentation:      Cephalic

 Amniotic Fluid
 AFI FV:      Subjectively within normal limits
Biophysical Evaluation

 Amniotic F.V:   Within normal limits       F. Tone:        Observed
 F. Movement:    Observed                   Score:          [DATE]
 F. Breathing:   Observed
Gestational Age

 Best:          39w 3d     Det. By:  Early Ultrasound         EDD:   01/14/14
                                     (06/25/13)
Impression

 SIUP at 39+3 weeks
 Normal amniotic fluid volume
 BPP [DATE]
Recommendations

 Follow-up as clinically indicated

 questions or concerns.

## 2017-02-07 ENCOUNTER — Other Ambulatory Visit: Payer: Self-pay | Admitting: Gynecologic Oncology

## 2017-02-07 DIAGNOSIS — R05 Cough: Secondary | ICD-10-CM

## 2017-02-07 DIAGNOSIS — R053 Chronic cough: Secondary | ICD-10-CM

## 2017-02-07 MED ORDER — BENZONATATE 100 MG PO CAPS: 100.0000 mg | ORAL_CAPSULE | Freq: Three times a day (TID) | ORAL | 0 refills | Status: DC | PRN

## 2017-02-07 MED ORDER — HYDROCOD POLST-CPM POLST ER 10-8 MG/5ML PO SUER: 5.0000 mL | Freq: Two times a day (BID) | ORAL | 0 refills | Status: DC | PRN

## 2017-02-07 NOTE — Progress Notes (Signed)
Reporting persistent cough.  Dry not productive.  No fever, chills.

## 2017-10-12 ENCOUNTER — Other Ambulatory Visit: Payer: Self-pay | Admitting: Gynecologic Oncology

## 2017-10-12 DIAGNOSIS — N390 Urinary tract infection, site not specified: Secondary | ICD-10-CM

## 2017-10-12 MED ORDER — NITROFURANTOIN MONOHYD MACRO 100 MG PO CAPS
100.0000 mg | ORAL_CAPSULE | Freq: Two times a day (BID) | ORAL | 0 refills | Status: AC
Start: 2017-10-12 — End: ?

## 2017-10-12 NOTE — Progress Notes (Signed)
Patient called the office with complaints of urinary frequency and dysuria.  No fever or chills reported.  Macrobid 100 mg BID for 7 days sent to pharmacy.  If symptoms persist, plan for culture.  She is to call with an update if her symptoms persist.

## 2018-04-10 ENCOUNTER — Other Ambulatory Visit: Payer: Self-pay | Admitting: Gynecologic Oncology

## 2018-04-10 DIAGNOSIS — J111 Influenza due to unidentified influenza virus with other respiratory manifestations: Secondary | ICD-10-CM

## 2018-04-10 MED ORDER — OSELTAMIVIR PHOSPHATE 75 MG PO CAPS: 75.0000 mg | ORAL_CAPSULE | Freq: Two times a day (BID) | ORAL | 0 refills | Status: AC

## 2018-04-10 NOTE — Progress Notes (Signed)
Patient presents with flu symptoms after exposure to patient with positive influenza A testing.  Per ID with hospital, patient needs to begin tamiflu immediately.

## 2018-05-25 ENCOUNTER — Other Ambulatory Visit: Payer: Self-pay | Admitting: Gynecologic Oncology

## 2018-05-25 DIAGNOSIS — J4521 Mild intermittent asthma with (acute) exacerbation: Secondary | ICD-10-CM

## 2018-05-25 MED ORDER — BECLOMETHASONE DIPROP HFA 40 MCG/ACT IN AERB: 2.0000 | INHALATION_SPRAY | Freq: Two times a day (BID) | RESPIRATORY_TRACT | 3 refills | Status: AC

## 2018-05-25 NOTE — Progress Notes (Signed)
Patient presented to the clinic with wheezing with asthma flare up due to increased pollen.  Refill sent on inhaler that has been used previously and tolerated well.

## 2020-06-19 ENCOUNTER — Other Ambulatory Visit: Payer: Self-pay | Admitting: Gynecologic Oncology

## 2020-06-19 MED ORDER — ZOLPIDEM TARTRATE 5 MG PO TABS: 5.0000 mg | ORAL_TABLET | Freq: Every evening | ORAL | 0 refills | Status: DC | PRN

## 2020-06-19 NOTE — Progress Notes (Signed)
Refill for insomnia.

## 2020-10-10 ENCOUNTER — Other Ambulatory Visit: Payer: Self-pay | Admitting: Gynecologic Oncology

## 2020-10-10 DIAGNOSIS — G4700 Insomnia, unspecified: Secondary | ICD-10-CM

## 2020-10-10 MED ORDER — ZOLPIDEM TARTRATE 5 MG PO TABS: 5.0000 mg | ORAL_TABLET | Freq: Every evening | ORAL | 0 refills | Status: DC | PRN

## 2020-10-10 NOTE — Progress Notes (Signed)
Patient called the office requesting a refill on ambien for intermittent insomnia. Reviewed precautions and potential side effects. All questions answered.  

## 2020-11-11 ENCOUNTER — Other Ambulatory Visit: Payer: Self-pay | Admitting: Gynecologic Oncology

## 2020-11-11 DIAGNOSIS — N951 Menopausal and female climacteric states: Secondary | ICD-10-CM

## 2020-11-11 MED ORDER — ESCITALOPRAM OXALATE 10 MG PO TABS: 10.0000 mg | ORAL_TABLET | Freq: Every day | ORAL | 3 refills | Status: AC

## 2020-11-11 NOTE — Progress Notes (Signed)
Patient called the office with menopausal symptoms. Plan to try lexapro for alleviate symptoms. She is aware of potential side effects.

## 2022-02-17 ENCOUNTER — Other Ambulatory Visit: Payer: Self-pay | Admitting: Gynecologic Oncology

## 2022-02-17 DIAGNOSIS — G4700 Insomnia, unspecified: Secondary | ICD-10-CM

## 2022-02-17 MED ORDER — ZOLPIDEM TARTRATE 10 MG PO TABS
10.0000 mg | ORAL_TABLET | Freq: Every evening | ORAL | 0 refills | Status: AC | PRN
Start: 2022-02-17 — End: 2022-03-09

## 2022-02-17 NOTE — Progress Notes (Signed)
Patient called the office requesting a refill on ambien for intermittent insomnia. Reviewed precautions and potential side effects. All questions answered.
